# Patient Record
Sex: Female | Born: 1985 | Race: White | Hispanic: No | Marital: Married | Smoking: Current every day smoker
Health system: Southern US, Community
[De-identification: ages and names within clinical notes are randomized; demographics above are authoritative.]

## PROBLEM LIST (undated history)

## (undated) DIAGNOSIS — F32A Depression, unspecified: Secondary | ICD-10-CM

## (undated) DIAGNOSIS — F329 Major depressive disorder, single episode, unspecified: Secondary | ICD-10-CM

## (undated) HISTORY — DX: Major depressive disorder, single episode, unspecified: F32.9

## (undated) HISTORY — PX: ESOPHAGOGASTRODUODENOSCOPY ENDOSCOPY: SHX5814

## (undated) HISTORY — DX: Depression, unspecified: F32.A

## (undated) HISTORY — PX: OTHER SURGICAL HISTORY: SHX169

## (undated) HISTORY — PX: TUBAL LIGATION: SHX77

---

## 2009-11-24 ENCOUNTER — Emergency Department: Payer: Self-pay | Admitting: Emergency Medicine

## 2010-03-25 ENCOUNTER — Emergency Department: Payer: Self-pay | Admitting: Emergency Medicine

## 2010-04-20 ENCOUNTER — Emergency Department: Payer: Self-pay | Admitting: Emergency Medicine

## 2010-09-10 ENCOUNTER — Emergency Department: Payer: Self-pay | Admitting: Emergency Medicine

## 2010-09-12 ENCOUNTER — Ambulatory Visit: Payer: Self-pay | Admitting: Nephrology

## 2011-02-16 ENCOUNTER — Emergency Department: Payer: Self-pay | Admitting: Emergency Medicine

## 2011-03-04 ENCOUNTER — Emergency Department: Payer: Self-pay | Admitting: Emergency Medicine

## 2011-04-28 ENCOUNTER — Emergency Department: Payer: Self-pay | Admitting: Emergency Medicine

## 2014-09-01 LAB — HM PAP SMEAR: HM Pap smear: NEGATIVE

## 2015-08-12 ENCOUNTER — Encounter: Payer: Self-pay | Admitting: Emergency Medicine

## 2015-08-12 ENCOUNTER — Emergency Department
Admission: EM | Admit: 2015-08-12 | Discharge: 2015-08-13 | Disposition: A | Discharge status: Home / Self Care | Payer: BLUE CROSS/BLUE SHIELD | Attending: Emergency Medicine | Admitting: Emergency Medicine

## 2015-08-12 DIAGNOSIS — R102 Pelvic and perineal pain: Secondary | ICD-10-CM

## 2015-08-12 DIAGNOSIS — N760 Acute vaginitis: Secondary | ICD-10-CM | POA: Diagnosis not present

## 2015-08-12 DIAGNOSIS — F17200 Nicotine dependence, unspecified, uncomplicated: Secondary | ICD-10-CM | POA: Insufficient documentation

## 2015-08-12 DIAGNOSIS — B9689 Other specified bacterial agents as the cause of diseases classified elsewhere: Secondary | ICD-10-CM

## 2015-08-12 LAB — URINALYSIS COMPLETE WITH MICROSCOPIC (ARMC ONLY)
Bacteria, UA: NONE SEEN
Bilirubin Urine: NEGATIVE
GLUCOSE, UA: NEGATIVE mg/dL
Hgb urine dipstick: NEGATIVE
Ketones, ur: NEGATIVE mg/dL
Leukocytes, UA: NEGATIVE
Nitrite: NEGATIVE
Protein, ur: NEGATIVE mg/dL
Specific Gravity, Urine: 1.015 (ref 1.005–1.030)
pH: 7 (ref 5.0–8.0)

## 2015-08-12 LAB — CBC WITH DIFFERENTIAL/PLATELET
BASOS ABS: 0.1 10*3/uL (ref 0–0.1)
BASOS PCT: 1 %
Eosinophils Absolute: 0.5 10*3/uL (ref 0–0.7)
Eosinophils Relative: 6 %
HEMATOCRIT: 39.5 % (ref 35.0–47.0)
Hemoglobin: 13.4 g/dL (ref 12.0–16.0)
LYMPHS PCT: 36 %
Lymphs Abs: 3.1 10*3/uL (ref 1.0–3.6)
MCH: 28.4 pg (ref 26.0–34.0)
MCHC: 34 g/dL (ref 32.0–36.0)
MCV: 83.6 fL (ref 80.0–100.0)
Monocytes Absolute: 0.7 10*3/uL (ref 0.2–0.9)
Monocytes Relative: 8 %
NEUTROS ABS: 4.4 10*3/uL (ref 1.4–6.5)
NEUTROS PCT: 49 %
Platelets: 298 10*3/uL (ref 150–440)
RBC: 4.73 MIL/uL (ref 3.80–5.20)
RDW: 14.8 % — ABNORMAL HIGH (ref 11.5–14.5)
WBC: 8.7 10*3/uL (ref 3.6–11.0)

## 2015-08-12 LAB — WET PREP, GENITAL
Sperm: NONE SEEN
Trich, Wet Prep: NONE SEEN
Yeast Wet Prep HPF POC: NONE SEEN

## 2015-08-12 LAB — COMPREHENSIVE METABOLIC PANEL
ALBUMIN: 4.4 g/dL (ref 3.5–5.0)
ALT: 14 U/L (ref 14–54)
AST: 18 U/L (ref 15–41)
Alkaline Phosphatase: 42 U/L (ref 38–126)
Anion gap: 7 (ref 5–15)
BILIRUBIN TOTAL: 0.2 mg/dL — AB (ref 0.3–1.2)
BUN: 20 mg/dL (ref 6–20)
CHLORIDE: 106 mmol/L (ref 101–111)
CO2: 25 mmol/L (ref 22–32)
CREATININE: 0.62 mg/dL (ref 0.44–1.00)
Calcium: 9.2 mg/dL (ref 8.9–10.3)
GFR calc Af Amer: >60 mL/min (ref >60)
GLUCOSE: 80 mg/dL (ref 65–99)
POTASSIUM: 4 mmol/L (ref 3.5–5.1)
Sodium: 138 mmol/L (ref 135–145)
TOTAL PROTEIN: 6.7 g/dL (ref 6.5–8.1)

## 2015-08-12 LAB — LIPASE, BLOOD: LIPASE: 28 U/L (ref 11–51)

## 2015-08-12 LAB — POCT PREGNANCY, URINE: PREG TEST UR: NEGATIVE

## 2015-08-12 MED ORDER — MORPHINE SULFATE (PF) 4 MG/ML IV SOLN
4.0000 mg | Freq: Once | INTRAVENOUS | Status: AC
  Administered 2015-08-12: 4 mg via INTRAVENOUS

## 2015-08-12 MED ORDER — MORPHINE SULFATE (PF) 4 MG/ML IV SOLN
INTRAVENOUS | Status: AC
  Administered 2015-08-12: 4 mg via INTRAVENOUS
  Filled 2015-08-12: qty 1

## 2015-08-12 MED ORDER — SODIUM CHLORIDE 0.9 % IV BOLUS (SEPSIS)
500.0000 mL | Freq: Once | INTRAVENOUS | Status: AC
  Administered 2015-08-12: 500 mL via INTRAVENOUS

## 2015-08-12 NOTE — ED Notes (Signed)
Pt informed of extended wait time for ultrasound. Pt and pts mother left ED treatment bed to go to vending machine. Pt in wheelchair and in NAD at this time.

## 2015-08-12 NOTE — ED Notes (Signed)
Pt states "it hurts down there, it feels like a lot of pressure." pt denies dysuria or vaginal discharge. Pt states "i feel better if i push." pt denies fever, diarrhea, vomiting.

## 2015-08-12 NOTE — ED Provider Notes (Addendum)
[facility] Emergency Department Provider Note  ____________________________________________   I have reviewed the triage vital signs and the nursing notes.   HISTORY  Chief Complaint Vaginal Pain    HPI Gabrielle Harmon is a 30 y.o. female presents today complaining of pelvic discomfort. No history of nausea vomiting or diarrhea. Pain began gradually this morning around 8:00. Has not had pain like this before except for when she was pregnant. Patient has had a tubal ligation. She denies pregnancy. No significant prior surgical history. She denies any fever or chills. The pain does not seem to radiate. Denies vaginal discharge. Denies dysuria. Denies urinary frequency. Denies flank pain. States that she simply has a discomfort like a pressure sensation in her pelvic region. Denies constipation.      History reviewed. No pertinent past medical history.  There are no active problems to display for this patient.   Past Surgical History  Procedure Laterality Date  . Esophagogastroduodenoscopy endoscopy    . Tubal ligation    . Adenocetomy      No current outpatient prescriptions on file.  Allergies Review of patient's allergies indicates no known allergies.  No family history on file.  Social History Social History  Substance Use Topics  . Smoking status: Current Every Day Smoker  . Smokeless tobacco: Never Used  . Alcohol Use: No    Review of Systems Constitutional: No fever/chills Eyes: No visual changes. ENT: No sore throat. No stiff neck no neck pain Cardiovascular: Denies chest pain. Respiratory: Denies shortness of breath. Gastrointestinal:   no vomiting.  No diarrhea.  No constipation. Genitourinary: Negative for dysuria. Musculoskeletal: Negative lower extremity swelling Skin: Negative for rash. Neurological: Negative for headaches, focal weakness or numbness. 10-point ROS otherwise  negative.  ____________________________________________   PHYSICAL EXAM:  VITAL SIGNS: ED Triage Vitals  Enc Vitals Group     BP 08/12/15 1850 112/75 mmHg     Pulse Rate 08/12/15 1850 88     Resp 08/12/15 1850 16     Temp 08/12/15 1850 98.2 F (36.8 C)     Temp Source 08/12/15 1850 Oral     SpO2 08/12/15 1850 100 %     Weight 08/12/15 1850 165 lb (74.844 kg)     Height 08/12/15 1850  (1.626 m)     Head Cir --      Peak Flow --      Pain Score 08/12/15 1910 7     Pain Loc --      Pain Edu? --      Excl. in GC? --     Constitutional: Alert and oriented. Well appearing and in no acute distress. Eyes: Conjunctivae are normal. PERRL. EOMI. Head: Atraumatic. Nose: No congestion/rhinnorhea. Mouth/Throat: Mucous membranes are moist.  Oropharynx non-erythematous. Neck: No stridor.   Nontender with no meningismus Cardiovascular: Normal rate, regular rhythm. Grossly normal heart sounds.  Good peripheral circulation. Respiratory: Normal respiratory effort.  No retractions. Lungs CTAB. Abdominal: Soft and Minimal tenderness to deep palpation in the suprapubic region. No distention. No guarding no rebound Back:  There is no focal tenderness or step off there is no midline tenderness there are no lesions noted. there is no CVA tenderness Musculoskeletal: No lower extremity tenderness. No joint effusions, no DVT signs strong distal pulses no edema Neurologic:  Normal speech and language. No gross focal neurologic deficits are appreciated.  Skin:  Skin is warm, dry and intact. No rash noted. Psychiatric: Mood and affect are normal. Speech  and behavior are normal.  ____________________________________________   LABS (all labs ordered are listed, but only abnormal results are displayed)  Labs Reviewed  URINALYSIS COMPLETEWITH MICROSCOPIC ([facility] ONLY) - Abnormal; Notable for the following:    Color, Urine YELLOW (*)    APPearance CLOUDY (*)    Squamous Epithelial / LPF 0-5 (*)     All other components within normal limits  CHLAMYDIA/NGC RT PCR ([facility] ONLY)  WET PREP, GENITAL  CBC WITH DIFFERENTIAL/PLATELET  COMPREHENSIVE METABOLIC PANEL  LIPASE, BLOOD  POC URINE PREG, ED  POCT PREGNANCY, URINE   ____________________________________________  EKG  I personally interpreted any EKGs ordered by me or triage  ____________________________________________  RADIOLOGY  I reviewed any imaging ordered by me or triage that were performed during my shift and, if possible, patient and/or family made aware of any abnormal findings. ____________________________________________   PROCEDURES  Procedure(s) performed: None  Critical Care performed: None  ____________________________________________   INITIAL IMPRESSION / ASSESSMENT AND PLAN / ED COURSE  Pertinent labs & imaging results that were available during my care of the patient were reviewed by me and considered in my medical decision making (see chart for details).  Urinalysis is reassuring, differential is broad, we'll obtain blood work and a pelvic exam and reassess. Abdomen is nonsurgical vital signs are reassuring.  ----------------------------------------- 9:39 PM on 08/12/2015 -----------------------------------------  Pelvic exam: Female nurse chaperone present, no external lesions noted, physiologic vaginal discharge noted with no purulent discharge, no cervical motion tenderness, there is left adnexal tenderness, no mass noted, mild right adnexal tenderness noted no right adnexal mass, there is no significant midline tenderness or or mass. No vaginal bleeding_____________  ----------------------------------------- 11:28 PM on 08/12/2015 -----------------------------------------  Awaiting US.  Dr. Manson PasseyBrown to take over at this time. _______________________________   FINAL CLINICAL IMPRESSION(S) / ED DIAGNOSES  Final diagnoses:  None      This chart was dictated using voice  recognition software.  Despite best efforts to proofread,  errors can occur which can change meaning.     Jeanmarie PlantJames A Meila Berke, MD 08/12/15 [id number]  Jeanmarie PlantJames A Shanard Treto, MD 08/12/15 [id number]  Jeanmarie PlantJames A Koreen Lizaola, MD 08/12/15 [phone]

## 2015-08-12 NOTE — ED Notes (Signed)
Pt states she had similar pain when [redacted] wks pregnant and baby was pressing on her kidneys. Pt has since had tubal ligation.

## 2015-08-13 ENCOUNTER — Emergency Department: Payer: BLUE CROSS/BLUE SHIELD

## 2015-08-13 LAB — CHLAMYDIA/NGC RT PCR (ARMC ONLY)
CHLAMYDIA TR: NOT DETECTED
N GONORRHOEAE: NOT DETECTED

## 2015-08-13 MED ORDER — OXYCODONE-ACETAMINOPHEN 5-325 MG PO TABS
ORAL_TABLET | ORAL | Status: AC
  Administered 2015-08-13: 1 via ORAL
  Filled 2015-08-13: qty 1

## 2015-08-13 MED ORDER — OXYCODONE-ACETAMINOPHEN 5-325 MG PO TABS
1.0000 | ORAL_TABLET | Freq: Once | ORAL | Status: AC
  Administered 2015-08-13: 1 via ORAL

## 2015-08-13 MED ORDER — OXYCODONE-ACETAMINOPHEN 5-325 MG PO TABS
ORAL_TABLET | ORAL | Status: AC
  Filled 2015-08-13: qty 1

## 2015-08-13 MED ORDER — METRONIDAZOLE 500 MG PO TABS: 500.0000 mg | ORAL_TABLET | Freq: Two times a day (BID) | ORAL | Status: AC

## 2015-08-13 MED ORDER — OXYCODONE-ACETAMINOPHEN 5-325 MG PO TABS: 1.0000 | ORAL_TABLET | ORAL | Status: DC | PRN

## 2015-08-13 NOTE — ED Notes (Signed)
Pt. Verbalizes understanding of d/c instructions, prescriptions, and follow-up care. Pt. displays no s/s of distress at this time. VS stable. Pt. Out of the unit in the wheelchair with mother.

## 2015-08-13 NOTE — Discharge Instructions (Signed)

## 2015-08-13 NOTE — ED Provider Notes (Signed)
I assumed care from Dr. Alphonzo LemmingsMcShane at 11:30 PM ultrasound revealed: US Pelvis Complete (Final result) Result time: 08/13/15 01:29:30   Final result by Rad Results In Interface (08/13/15 01:29:30)   Narrative:   CLINICAL DATA: Acute onset of mid pelvic pressure. Initial encounter.  EXAM: TRANSABDOMINAL AND TRANSVAGINAL ULTRASOUND OF PELVIS  TECHNIQUE: Both transabdominal and transvaginal ultrasound examinations of the pelvis were performed. Transabdominal technique was performed for global imaging of the pelvis including uterus, ovaries, adnexal regions, and pelvic cul-de-sac. It was necessary to proceed with endovaginal exam following the transabdominal exam to visualize the uterus and ovaries in greater detail.  COMPARISON: CT of the abdomen and pelvis from 09/12/2010  FINDINGS: Uterus  Measurements: 7.6 x 3.8 x 5.4 cm. No fibroids or other mass visualized.  Endometrium  Thickness: 1.0 cm. No focal abnormality visualized.  Right ovary  Measurements: 3.1 x 2.0 x 2.0 cm. Normal appearance/no adnexal mass.  Left ovary  Measurements: 3.3 x 2.6 x 2.2 cm. Normal appearance/no adnexal mass.  Other findings  No abnormal free fluid.  IMPRESSION: Unremarkable pelvic ultrasound. No evidence for ovarian torsion.   Electronically Signed By: Roanna RaiderJeffery Chang M.D. On: 08/13/2015 01:29          US Transvaginal Non-OB (Final result) Result time: 08/13/15 01:29:30   Final result by Rad Results In Interface (08/13/15 01:29:30)   Narrative:   CLINICAL DATA: Acute onset of mid pelvic pressure. Initial encounter.  EXAM: TRANSABDOMINAL AND TRANSVAGINAL ULTRASOUND OF PELVIS  TECHNIQUE: Both transabdominal and transvaginal ultrasound examinations of the pelvis were performed. Transabdominal technique was performed for global imaging of the pelvis including uterus, ovaries, adnexal regions, and pelvic cul-de-sac. It was necessary to proceed with endovaginal exam  following the transabdominal exam to visualize the uterus and ovaries in greater detail.  COMPARISON: CT of the abdomen and pelvis from 09/12/2010  FINDINGS: Uterus  Measurements: 7.6 x 3.8 x 5.4 cm. No fibroids or other mass visualized.  Endometrium  Thickness: 1.0 cm. No focal abnormality visualized.  Right ovary  Measurements: 3.1 x 2.0 x 2.0 cm. Normal appearance/no adnexal mass.  Left ovary  Measurements: 3.3 x 2.6 x 2.2 cm. Normal appearance/no adnexal mass.  Other findings  No abnormal free fluid.  IMPRESSION: Unremarkable pelvic ultrasound. No evidence for ovarian torsion.   Electronically Signed By: Roanna RaiderJeffery Chang M.D. On: 08/13/2015 01:29      Laboratory data revealed evidence of bacterial vaginosis. Patient be referred to Dr. Greggory Keenefrancesco for pelvic pain  Gabrielle Harmon N Brown, MD 08/13/15 [phone]

## 2017-01-10 ENCOUNTER — Ambulatory Visit (INDEPENDENT_AMBULATORY_CARE_PROVIDER_SITE_OTHER): Payer: BLUE CROSS/BLUE SHIELD | Admitting: Maternal Newborn

## 2017-01-10 ENCOUNTER — Encounter: Payer: Self-pay | Admitting: Maternal Newborn

## 2017-01-10 VITALS — BP 110/60 | HR 108 | Ht 64.0 in | Wt 162.0 lb

## 2017-01-10 DIAGNOSIS — N946 Dysmenorrhea, unspecified: Secondary | ICD-10-CM | POA: Diagnosis not present

## 2017-01-10 DIAGNOSIS — R102 Pelvic and perineal pain: Secondary | ICD-10-CM | POA: Diagnosis not present

## 2017-01-10 DIAGNOSIS — Z124 Encounter for screening for malignant neoplasm of cervix: Secondary | ICD-10-CM | POA: Diagnosis not present

## 2017-01-10 DIAGNOSIS — Z01419 Encounter for gynecological examination (general) (routine) without abnormal findings: Secondary | ICD-10-CM

## 2017-01-10 MED ORDER — NORETHIN-ETH ESTRAD-FE BIPHAS 1 MG-10 MCG / 10 MCG PO TABS: 1.0000 | ORAL_TABLET | Freq: Every day | ORAL | 11 refills | Status: DC

## 2017-01-10 NOTE — Progress Notes (Signed)
Gynecology Annual Exam  PCP: [facility]  Chief Complaint:  Chief Complaint  Patient presents with  . Gynecologic Exam    cramping in between periods; had two cycles last in the last month and a half.    History of Present Illness: Patient is a 31 y.o. [id number] presents for annual exam. The patient has no complaints today.   LMP: Patient's last menstrual period was 12/17/2016 (exact date). Average Interval: regular, 30 days, but has had two periods in last 1.5 months Duration of flow: 5 days Heavy Menses: no Clots: no Intermenstrual Bleeding: yes Postcoital Bleeding: no Dysmenorrhea: yes  The patient is sexually active. She currently uses tubal ligation for contraception. She denies dyspareunia.  The patient does perform self breast exams.  There is no notable family history of breast or ovarian cancer in her family.  The patient wears seatbelts: yes.   The patient has regular exercise: yes.    The patient denies current symptoms of depression.    Review of Systems  Constitutional: Negative for chills, fever and weight loss.  HENT: Negative for congestion, hearing loss and sinus pain.   Eyes: Negative for blurred vision, pain and redness.  Respiratory: Negative for cough, shortness of breath and wheezing.   Cardiovascular: Negative for chest pain and palpitations.  Gastrointestinal: Positive for heartburn. Negative for diarrhea and nausea.  Genitourinary: Negative for dysuria, frequency and urgency.       Pelvic pain  Musculoskeletal: Positive for joint pain.  Skin: Negative.   Neurological: Negative for dizziness, sensory change and headaches.  Endo/Heme/Allergies: Negative.   Psychiatric/Behavioral: Negative for depression. The patient is not nervous/anxious.   All other systems reviewed and are negative.   Past Medical History:  Past Medical History:  Diagnosis Date  . Depression     Past Surgical History:  Past Surgical History:  Procedure  Laterality Date  . adenocetomy    . ESOPHAGOGASTRODUODENOSCOPY ENDOSCOPY    . TUBAL LIGATION      Gynecologic History:  Patient's last menstrual period was 12/17/2016 (exact date). Contraception: tubal ligation Last Pap: 09/01/2014. Results were: NIL and HR HPV negative   Obstetric History: [id number]  Family History:  Family History  Problem Relation Age of Onset  . Diabetes Paternal Aunt     Social History:  Social History   Social History  . Marital status: Married    Spouse name: N/A  . Number of children: 3  . Years of education: N/A   Occupational History  . Not on file.   Social History Main Topics  . Smoking status: Current Every Day Smoker    Types: Cigarettes  . Smokeless tobacco: Never Used     Comment: 1/2 ppd  . Alcohol use No  . Drug use: No  . Sexual activity: Yes    Birth control/ protection: Surgical   Other Topics Concern  . Not on file   Social History Narrative  . No narrative on file    Allergies:  No Known Allergies  Medications: Prior to Admission medications   Medication Sig Start Date End Date Taking? Authorizing Provider  citalopram (CELEXA) 40 MG tablet Take 40 mg by mouth every morning. 12/25/16   [provider]  IBU 600 MG tablet Take 1 tablet by mouth as needed. 12/25/16   [provider]  Phendimetrazine Tartrate 35 MG TABS Take 1 tablet by mouth daily. 12/27/16   [provider]  traMADol (ULTRAM) 50 MG tablet Take 1 tablet by  mouth as needed. 12/18/16   [provider]    Physical Exam Vitals: Blood pressure 110/60, pulse (!) 108, height 5\' 4"  (1.626 m), weight 162 lb (73.5 kg), last menstrual period 12/17/2016.  General: NAD HEENT: normocephalic, anicteric Thyroid: no enlargement, no palpable nodules Pulmonary: No increased work of breathing, CTAB Cardiovascular: RRR, distal pulses 2+ Breast: Breasts symmetrical, no tenderness, no palpable nodules or masses, no skin or nipple  retraction present, no nipple discharge.  No axillary or supraclavicular lymphadenopathy. Abdomen: NABS, soft, non-tender, non-distended.  Umbilicus without lesions.  No hepatomegaly, splenomegaly or masses palpable. No evidence of hernia  Genitourinary:  External: Normal external female genitalia.  Normal urethral meatus, normal  Bartholin's and Skene's glands.    Vagina: Normal vaginal mucosa, no evidence of prolapse.    Cervix: Grossly normal in appearance, no bleeding  Uterus: Non-enlarged, mobile, normal contour.  No CMT  Adnexa: ovaries non-enlarged, no adnexal masses, mild tenderness to palpation  in bilateral adnexal area  Rectal: deferred  Lymphatic: no evidence of inguinal lymphadenopathy Extremities: no edema, erythema, or tenderness Neurologic: Grossly intact Psychiatric: mood appropriate, affect full  Assessment: 31 y.o. [id number] routine annual exam with dysmenorrhea and pelvic pain.  Plan: Problem List Items Addressed This Visit    None    Visit Diagnoses    Encounter for annual routine gynecological examination    -  Primary   Pap smear for cervical cancer screening       Relevant Orders   Pap IG and HPV (high risk) DNA detection   Severe dysmenorrhea       Relevant Medications   Norethindrone-Ethinyl Estradiol-Fe Biphas (LO LOESTRIN FE) 1 MG-10 MCG / 10 MCG tablet      1) STI screening was offered and declined.  2) ASCCP guidelines and rationale discussed.  Patient opts for yearly screening interval.  3) Contraception - Patient has had a tubal ligation. Asked druing this visit about tubal reversal.  Advised her to make appointment with MD who would review surgical records and determine whether this is an option and counsel on risks/benefits/chances of successful pregnancy.  4) Routine healthcare maintenance including cholesterol, diabetes screening discussed; managed by PCP.  5) Has severe dysmenorrhea with cycles, cramping and pelvic pain in between cycles, and  recently had an irregular cycle. Discussed endometriosis as possible underlying pathology, and that the definitive diagnosis would be a laparoscopy. She is already taking NSAIDs for chronic joint pain and does not get relief. Will try Lo LoEstrin to see if symptoms improve. ACHES reviewed and patient warned about increased risk when smoking with OCP use.   6) RTO in 2 months if OCPs do not help control dysmenorrhea/cramping.   Marcelyn BruinsJacelyn Kaela Beitz, CNM 01/10/2017  4:54 PM

## 2017-01-15 ENCOUNTER — Encounter: Payer: Self-pay | Admitting: Maternal Newborn

## 2017-01-15 LAB — PAP IG AND HPV HIGH-RISK
HPV, HIGH-RISK: POSITIVE — AB
PAP Smear Comment: 0

## 2017-01-16 ENCOUNTER — Encounter: Payer: Self-pay | Admitting: Maternal Newborn

## 2017-01-17 ENCOUNTER — Other Ambulatory Visit: Payer: Self-pay | Admitting: Maternal Newborn

## 2017-01-17 MED ORDER — CYCLOBENZAPRINE HCL 10 MG PO TABS: 10.0000 mg | ORAL_TABLET | Freq: Every day | ORAL | 0 refills | Status: DC

## 2017-01-17 NOTE — Progress Notes (Unsigned)
Sent Rx for cyclobenzaprine per patient request for medication to help with chronic pelvic pain. MyChart message sent to inform patient of prescription.  Marcelyn BruinsJacelyn Schmid, CNM 01/17/2017  9:35 PM

## 2017-03-12 ENCOUNTER — Other Ambulatory Visit: Payer: Self-pay | Admitting: Maternal Newborn

## 2017-04-15 ENCOUNTER — Other Ambulatory Visit: Payer: Self-pay | Admitting: Maternal Newborn

## 2017-04-26 ENCOUNTER — Telehealth: Payer: Self-pay

## 2017-04-26 NOTE — Telephone Encounter (Signed)
Pt states she lost a large clot but not currently on her period. She is experiencing a lot of cramping. Please have JS advise due to pt history. Thank you. Pt cb# [phone].

## 2017-05-02 NOTE — Telephone Encounter (Signed)
Thanks, she is coming in next week for pelvic ultrasound and f/u appointment.

## 2017-05-03 ENCOUNTER — Ambulatory Visit: Payer: BLUE CROSS/BLUE SHIELD | Admitting: Maternal Newborn

## 2017-05-06 ENCOUNTER — Other Ambulatory Visit: Payer: Self-pay | Admitting: Maternal Newborn

## 2017-05-06 DIAGNOSIS — N939 Abnormal uterine and vaginal bleeding, unspecified: Secondary | ICD-10-CM

## 2017-05-07 ENCOUNTER — Encounter: Payer: Self-pay | Admitting: Obstetrics & Gynecology

## 2017-05-07 ENCOUNTER — Ambulatory Visit: Payer: Self-pay | Admitting: Maternal Newborn

## 2017-05-07 ENCOUNTER — Ambulatory Visit (INDEPENDENT_AMBULATORY_CARE_PROVIDER_SITE_OTHER): Payer: BLUE CROSS/BLUE SHIELD | Admitting: Obstetrics & Gynecology

## 2017-05-07 ENCOUNTER — Ambulatory Visit (INDEPENDENT_AMBULATORY_CARE_PROVIDER_SITE_OTHER): Payer: BLUE CROSS/BLUE SHIELD

## 2017-05-07 VITALS — BP 100/60 | Ht 64.0 in | Wt 176.0 lb

## 2017-05-07 DIAGNOSIS — N938 Other specified abnormal uterine and vaginal bleeding: Secondary | ICD-10-CM | POA: Diagnosis not present

## 2017-05-07 DIAGNOSIS — N939 Abnormal uterine and vaginal bleeding, unspecified: Secondary | ICD-10-CM

## 2017-05-07 NOTE — Patient Instructions (Signed)
Call and schedule after your next period starts  Levonorgestrel intrauterine device (IUD) What is this medicine? LEVONORGESTREL IUD (LEE voe nor jes trel) is a contraceptive (birth control) device. The device is placed inside the uterus by a healthcare professional. It is used to prevent pregnancy. This device can also be used to treat heavy bleeding that occurs during your period. This medicine may be used for other purposes; ask your health care provider or pharmacist if you have questions. COMMON BRAND NAME(S): Cameron Ali What should I tell my health care provider before I take this medicine? They need to know if you have any of these conditions: -abnormal Pap smear -cancer of the breast, uterus, or cervix -diabetes -endometritis -genital or pelvic infection now or in the past -have more than one sexual partner or your partner has more than one partner -heart disease -history of an ectopic or tubal pregnancy -immune system problems -IUD in place -liver disease or tumor -problems with blood clots or take blood-thinners -seizures -use intravenous drugs -uterus of unusual shape -vaginal bleeding that has not been explained -an unusual or allergic reaction to levonorgestrel, other hormones, silicone, or polyethylene, medicines, foods, dyes, or preservatives -pregnant or trying to get pregnant -breast-feeding How should I use this medicine? This device is placed inside the uterus by a health care professional. Talk to your pediatrician regarding the use of this medicine in children. Special care may be needed. Overdosage: If you think you have taken too much of this medicine contact a poison control center or emergency room at once. NOTE: This medicine is only for you. Do not share this medicine with others. What if I miss a dose? This does not apply. Depending on the brand of device you have inserted, the device will need to be replaced every 3 to 5 years if you  wish to continue using this type of birth control. What may interact with this medicine? Do not take this medicine with any of the following medications: -amprenavir -bosentan -fosamprenavir This medicine may also interact with the following medications: -aprepitant -armodafinil -barbiturate medicines for inducing sleep or treating seizures -bexarotene -boceprevir -griseofulvin -medicines to treat seizures like carbamazepine, ethotoin, felbamate, oxcarbazepine, phenytoin, topiramate -modafinil -pioglitazone -rifabutin -rifampin -rifapentine -some medicines to treat HIV infection like atazanavir, efavirenz, indinavir, lopinavir, nelfinavir, tipranavir, ritonavir -St. John's wort -warfarin This list may not describe all possible interactions. Give your health care provider a list of all the medicines, herbs, non-prescription drugs, or dietary supplements you use. Also tell them if you smoke, drink alcohol, or use illegal drugs. Some items may interact with your medicine. What should I watch for while using this medicine? Visit your doctor or health care professional for regular check ups. See your doctor if you or your partner has sexual contact with others, becomes HIV positive, or gets a sexual transmitted disease. This product does not protect you against HIV infection (AIDS) or other sexually transmitted diseases. You can check the placement of the IUD yourself by reaching up to the top of your vagina with clean fingers to feel the threads. Do not pull on the threads. It is a good habit to check placement after each menstrual period. Call your doctor right away if you feel more of the IUD than just the threads or if you cannot feel the threads at all. The IUD may come out by itself. You may become pregnant if the device comes out. If you notice that the IUD has come out use a  backup birth control method like condoms and call your health care provider. Using tampons will not change the  position of the IUD and are okay to use during your period. This IUD can be safely scanned with magnetic resonance imaging (MRI) only under specific conditions. Before you have an MRI, tell your healthcare provider that you have an IUD in place, and which type of IUD you have in place. What side effects may I notice from receiving this medicine? Side effects that you should report to your doctor or health care professional as soon as possible: -allergic reactions like skin rash, itching or hives, swelling of the face, lips, or tongue -fever, flu-like symptoms -genital sores -high blood pressure -no menstrual period for 6 weeks during use -pain, swelling, warmth in the leg -pelvic pain or tenderness -severe or sudden headache -signs of pregnancy -stomach cramping -sudden shortness of breath -trouble with balance, talking, or walking -unusual vaginal bleeding, discharge -yellowing of the eyes or skin Side effects that usually do not require medical attention (report to your doctor or health care professional if they continue or are bothersome): -acne -breast pain -change in sex drive or performance -changes in weight -cramping, dizziness, or faintness while the device is being inserted -headache -irregular menstrual bleeding within first 3 to 6 months of use -nausea This list may not describe all possible side effects. Call your doctor for medical advice about side effects. You may report side effects to FDA at 1-800-FDA-1088. Where should I keep my medicine? This does not apply. NOTE: This sheet is a summary. It may not cover all possible information. If you have questions about this medicine, talk to your doctor, pharmacist, or health care provider.  2018 Elsevier/Gold Standard (2015-12-09 14:14:56)

## 2017-05-07 NOTE — Progress Notes (Signed)
  HPI: Dysfunctional Uterine Bleeding Patient complains of irregular menses. She had been bleeding irregularly. She is now bleeding every 14-30 days and menses are lasting 3 days. She changes her pad or tampon every a few hours. Clots are variable in size. Dysmenorrhea:moderate, occurring throughout menses and even when not. Cyclic symptoms include: nausea. Current contraception: tubal ligation. History of infertility: no. History of abnormal Pap smear: no.  Ultrasound demonstrates no masses seen These findings are Pelvis normal  PMHx: She  has a past medical history of Depression. Also,  has a past surgical history that includes Esophagogastroduodenoscopy endoscopy; Tubal ligation; and adenocetomy., family history includes Diabetes in her paternal aunt.,  reports that she has been smoking cigarettes.  she has never used smokeless tobacco. She reports that she does not drink alcohol or use drugs.  She has a current medication list which includes the following prescription(s): citalopram, cyclobenzaprine, tramadol, ibu, norethindrone-ethinyl estradiol-fe biphas, and phendimetrazine tartrate. Also, has No Known Allergies.  Review of Systems  All other systems reviewed and are negative.  Objective: BP 100/60   Ht 5\' 4"  (1.626 m)   Wt 176 lb (79.8 kg)   LMP 03/27/2017   BMI 30.21 kg/m   Physical examination Constitutional NAD, Conversant  Skin No rashes, lesions or ulceration.   Extremities: Moves all appropriately.  Normal ROM for age. No lymphadenopathy.  Neuro: Grossly intact  Psych: Oriented to PPT.  Normal mood. Normal affect.   No results found.  Assessment:  DUB (dysfunctional uterine bleeding) -  Patient has abnormal uterine bleeding . She has a normal exam today, with no evidence of lesions.  Evaluation includes the following: exam, labs such as hormonal testing, and pelvic ultrasound to evaluate for any structural gynecologic abnormalities.  Patient to follow up after  testing.  Treatment option for menorrhagia or menometrorrhagia discussed in great detail with the patient.  Options include hormonal therapy, IUD therapy such as Mirena, D&C, Ablation, and Hysterectomy.  The pros and cons of each option discussed with patient.  Desires IUD at this time.  Schedule after periods.  Also, desires tubal reversal Plan: Ambulatory referral to Infertility  A total of 15 minutes were spent face-to-face with the patient during this encounter and over half of that time dealt with counseling and coordination of care.  Annamarie MajorPaul Jayel Scaduto, MD, Merlinda FrederickFACOG [facility], [facility] 05/07/2017  2:30 PM

## 2017-05-16 ENCOUNTER — Encounter: Payer: Self-pay | Admitting: Maternal Newborn

## 2017-05-17 ENCOUNTER — Encounter: Payer: Self-pay | Admitting: Maternal Newborn

## 2017-05-20 ENCOUNTER — Other Ambulatory Visit: Payer: Self-pay | Admitting: Maternal Newborn

## 2017-05-22 ENCOUNTER — Other Ambulatory Visit: Payer: Self-pay | Admitting: Maternal Newborn

## 2017-05-22 MED ORDER — CYCLOBENZAPRINE HCL 10 MG PO TABS: 10.0000 mg | ORAL_TABLET | Freq: Every day | ORAL | 0 refills | Status: DC

## 2017-05-22 NOTE — Telephone Encounter (Signed)
This was sitting in [facility]-Clinical pool. Message forwarded to Annie Sableita, JS nurse

## 2017-06-14 ENCOUNTER — Telehealth: Payer: Self-pay | Admitting: Maternal Newborn

## 2017-06-14 NOTE — Telephone Encounter (Signed)
Noted. Will order to arrive by apt date/time. 

## 2017-06-14 NOTE — Telephone Encounter (Signed)
Patient is schedule 07/01/17 at 10 am with JS for mirena insertion

## 2017-06-25 ENCOUNTER — Other Ambulatory Visit: Payer: Self-pay | Admitting: Maternal Newborn

## 2017-07-01 ENCOUNTER — Ambulatory Visit: Payer: BLUE CROSS/BLUE SHIELD | Admitting: Maternal Newborn

## 2017-07-01 ENCOUNTER — Other Ambulatory Visit: Payer: Self-pay | Admitting: Maternal Newborn

## 2017-07-03 NOTE — Telephone Encounter (Signed)
Pt cancelled apt. Mirena placed back in stock. Will need to reserve again if pt reschedules.

## 2017-07-31 ENCOUNTER — Other Ambulatory Visit: Payer: Self-pay | Admitting: Maternal Newborn

## 2017-08-09 ENCOUNTER — Emergency Department
Admission: EM | Admit: 2017-08-09 | Discharge: 2017-08-09 | Disposition: A | Discharge status: Home / Self Care | Payer: BLUE CROSS/BLUE SHIELD | Attending: Emergency Medicine | Admitting: Emergency Medicine

## 2017-08-09 ENCOUNTER — Encounter: Payer: Self-pay | Admitting: Emergency Medicine

## 2017-08-09 ENCOUNTER — Other Ambulatory Visit: Payer: Self-pay

## 2017-08-09 DIAGNOSIS — Z79899 Other long term (current) drug therapy: Secondary | ICD-10-CM | POA: Insufficient documentation

## 2017-08-09 DIAGNOSIS — F17210 Nicotine dependence, cigarettes, uncomplicated: Secondary | ICD-10-CM | POA: Insufficient documentation

## 2017-08-09 DIAGNOSIS — R102 Pelvic and perineal pain: Secondary | ICD-10-CM | POA: Insufficient documentation

## 2017-08-09 DIAGNOSIS — M545 Low back pain: Secondary | ICD-10-CM | POA: Insufficient documentation

## 2017-08-09 LAB — URINALYSIS, COMPLETE (UACMP) WITH MICROSCOPIC
Bacteria, UA: NONE SEEN
Bilirubin Urine: NEGATIVE
Glucose, UA: NEGATIVE mg/dL
Hgb urine dipstick: NEGATIVE
Ketones, ur: NEGATIVE mg/dL
Leukocytes, UA: NEGATIVE
Nitrite: NEGATIVE
Protein, ur: NEGATIVE mg/dL
Specific Gravity, Urine: 1.009 (ref 1.005–1.030)
pH: 8 (ref 5.0–8.0)

## 2017-08-09 LAB — POCT PREGNANCY, URINE: PREG TEST UR: NEGATIVE

## 2017-08-09 MED ORDER — CEPHALEXIN 500 MG PO CAPS: 500.0000 mg | ORAL_CAPSULE | Freq: Three times a day (TID) | ORAL | 0 refills | Status: DC

## 2017-08-09 NOTE — ED Notes (Addendum)
Pt stating for the last 2 to 3 hours she has had pressure in her vagina. Pt stating lower back pain that is coming and going. Pt stating tubal ligation 9 years ago. Pt denying any vaginal discharge or issues with urination. Pt stating that her BM have been normal. Pushing down makes pain better. Pt stating the pain has progressively worsened since 1400

## 2017-08-09 NOTE — ED Triage Notes (Signed)
Has a pain/pressure in vaginal area.  Reports nothing is coming out or in there and she has checked.  Denies discharge.  Started today.  Denies urinary sx.  Pain relieved when bears down.  Not pregnant.

## 2017-08-09 NOTE — ED Notes (Signed)
Reviewed discharge instructions, follow-up care, and prescriptions with patient. Patient verbalized understanding of all information reviewed. Patient stable, with no distress noted at this time.    

## 2017-08-09 NOTE — ED Provider Notes (Signed)
[facility] Emergency Department Provider Note  ____________________________________________  Time seen: Approximately 6:41 PM  I have reviewed the triage vital signs and the nursing notes.   HISTORY  Chief Complaint vaginal pressure    HPI Gabrielle Harmon is a 32 y.o. female presents to the emergency department with suprapubic pain and bilateral low back  pain that started today.  Patient denies dysuria, increased urinary frequency or hematuria.  No nausea or vomiting.  No dyspareunia, changes in vaginal discharge or odor.  Patient has been in a monogamous relationship for the past 2 years and has no concerns for STDs.  No alleviating measures have been attempted.   Past Medical History:  Diagnosis Date  . Depression     Patient Active Problem List   Diagnosis Date Noted  . DUB (dysfunctional uterine bleeding) 05/07/2017  . Severe dysmenorrhea 01/10/2017  . Pelvic pain 01/10/2017    Past Surgical History:  Procedure Laterality Date  . adenocetomy    . ESOPHAGOGASTRODUODENOSCOPY ENDOSCOPY    . TUBAL LIGATION      Prior to Admission medications   Medication Sig Start Date End Date Taking? Authorizing Provider  cephALEXin (KEFLEX) 500 MG capsule Take 1 capsule (500 mg total) by mouth 3 (three) times daily for 10 days. 08/09/17 08/19/17  Orvil Feil, PA-C  citalopram (CELEXA) 40 MG tablet Take 40 mg by mouth every morning. 12/25/16   [provider]  cyclobenzaprine (FLEXERIL) 10 MG tablet TAKE 1 TABLET(10 MG) BY MOUTH AT BEDTIME 06/25/17   Oswaldo Conroy, CNM  cyclobenzaprine (FLEXERIL) 10 MG tablet TAKE 1 TABLET BY MOUTH AT BEDTIME 07/31/17   Oswaldo Conroy, CNM  IBU 600 MG tablet Take 1 tablet by mouth as needed. 12/25/16   [provider]  Norethindrone-Ethinyl Estradiol-Fe Biphas (LO LOESTRIN FE) 1 MG-10 MCG / 10 MCG tablet Take 1 tablet by mouth daily. Patient not taking: Reported on 05/07/2017 01/10/17   Oswaldo Conroy,  CNM  Phendimetrazine Tartrate 35 MG TABS Take 1 tablet by mouth daily. 12/27/16   [provider]  traMADol (ULTRAM) 50 MG tablet Take 1 tablet by mouth as needed. 12/18/16   [provider]    Allergies Patient has no known allergies.  Family History  Problem Relation Age of Onset  . Diabetes Paternal Aunt     Social History Social History   Tobacco Use  . Smoking status: Current Every Day Smoker    Types: Cigarettes  . Smokeless tobacco: Never Used  . Tobacco comment: 1/2 ppd  Substance Use Topics  . Alcohol use: No  . Drug use: No     Review of Systems  Constitutional: No fever/chills Eyes: No visual changes. No discharge ENT: No upper respiratory complaints. Cardiovascular: no chest pain. Respiratory: no cough. No SOB. Gastrointestinal: No abdominal pain.  No nausea, no vomiting.  No diarrhea.  No constipation. Genitourinary: Patient has suprapubic pain. Musculoskeletal: Negative for musculoskeletal pain. Skin: Negative for rash, abrasions, lacerations, ecchymosis. Neurological: Negative for headaches, focal weakness or numbness.   ____________________________________________   PHYSICAL EXAM:  VITAL SIGNS: ED Triage Vitals  Enc Vitals Group     BP 08/09/17 1731 104/65     Pulse Rate 08/09/17 1731 91     Resp 08/09/17 1731 18     Temp 08/09/17 1731 98.1 F (36.7 C)     Temp Source 08/09/17 1731 Oral     SpO2 08/09/17 1731 98 %     Weight 08/09/17 1727  178 lb (80.7 kg)     Height 08/09/17 1727 5\' 4"  (1.626 m)     Head Circumference --      Peak Flow --      Pain Score 08/09/17 1727 6     Pain Loc --      Pain Edu? --      Excl. in GC? --      Constitutional: Alert and oriented. Well appearing and in no acute distress. Eyes: Conjunctivae are normal. PERRL. EOMI. Head: Atraumatic. Cardiovascular: Normal rate, regular rhythm. Normal S1 and S2.  Good peripheral circulation. Respiratory: Normal respiratory effort without tachypnea or  retractions. Lungs CTAB. Good air entry to the bases with no decreased or absent breath sounds. Gastrointestinal: Bowel sounds 4 quadrants. Patient has suprapubic pain to palpation. No guarding or rigidity. No palpable masses. No distention. No CVA tenderness. Musculoskeletal: Full range of motion to all extremities. No gross deformities appreciated. Neurologic:  Normal speech and language. No gross focal neurologic deficits are appreciated.  Skin:  Skin is warm, dry and intact. No rash noted. Psychiatric: Mood and affect are normal. Speech and behavior are normal. Patient exhibits appropriate insight and judgement.   ____________________________________________   LABS (all labs ordered are listed, but only abnormal results are displayed)  Labs Reviewed  URINALYSIS, COMPLETE (UACMP) WITH MICROSCOPIC - Abnormal; Notable for the following components:      Result Value   Color, Urine YELLOW (*)    APPearance HAZY (*)    All other components within normal limits  URINE CULTURE  POC URINE PREG, ED  POCT PREGNANCY, URINE   ____________________________________________  EKG   ____________________________________________  RADIOLOGY   No results found.  ____________________________________________    PROCEDURES  Procedure(s) performed:    Procedures    Medications - No data to display   ____________________________________________   INITIAL IMPRESSION / ASSESSMENT AND PLAN / ED COURSE  Pertinent labs & imaging results that were available during my care of the patient were reviewed by me and considered in my medical decision making (see chart for details).  Review of the [place] CSRS was performed in accordance of the [facility] prior to dispensing any controlled drugs.     Assessment and Plan:  Suprapubic discomfort Patient presents to the emergency department with suprapubic pain and bilateral low back pain for the past 2 to 3 hours without fever and chills.  Patient  denies abdominal pain, nausea, vomiting or bilateral flank pain.  Abdominal exam was completely reassuring with only midline suprapubic tenderness elicited with palpation and no guarding or rigidity.  Urinalysis was  unconcerning for acute cystitis. However, given patient's symptoms I suspect that early cystitis is likely.  Urine was sent for culture and patient was started empirically on Keflex.  Patient was advised to return to the emergency department immediately if midline suprapubic tenderness continues after 2-3 days.  Patient declined pelvic examination in the emergency department as patient states that she has no concern for gonorrhea or chlamydia and has had no dyspareunia. Return precautions were given during two instances of this emergency department encounter. All patient questions were answered.     ____________________________________________  FINAL CLINICAL IMPRESSION(S) / ED DIAGNOSES  Final diagnoses:  Suprapubic pain      NEW MEDICATIONS STARTED DURING THIS VISIT:  ED Discharge Orders        Ordered    cephALEXin (KEFLEX) 500 MG capsule  3 times daily     08/09/17 1931  This chart was dictated using voice recognition software/Dragon. Despite best efforts to proofread, errors can occur which can change the meaning. Any change was purely unintentional.    Orvil Feil, PA-C 08/09/17 1943    Arnaldo Natal, MD 08/09/17 2139

## 2017-08-12 LAB — URINE CULTURE: Culture: 40000 — AB

## 2017-08-13 ENCOUNTER — Telehealth: Payer: Self-pay | Admitting: Pharmacist

## 2017-08-13 MED ORDER — AMOXICILLIN 500 MG PO CAPS: 500.0000 mg | ORAL_CAPSULE | Freq: Three times a day (TID) | ORAL | 0 refills | Status: AC

## 2017-08-13 NOTE — Progress Notes (Signed)
ED Antimicrobial Stewardship Positive Culture Follow Up   Gabrielle Harmon is an 32 y.o. female who presented to [facility] on 08/09/17 with a chief complaint of suprapubic pain. Urine culture resulted and growing 40,000 colonies enterococcus faecalis. Discharged on Keflex.   []  Treated with Keflex, organism resistant to prescribed antimicrobial  New antibiotic prescription: Amoxicillin 500mg  TID x 5 days  [facility] [address]  ED Provider: Dr. Willy EddyPatrick Robinson   Discussed with patient to stop Keflex and start new antibiotic, amoxicillin. Patient states she is in a lot of flank/back pain and is not feeling any better since she left the ED. Patient asks if she can get have pain medication called in and was advised to go see doctor or ED if she is in a lot of pain and/or the pain does not get better.   Recent Results (from the past 720 hour(s))  Urine culture     Status: Abnormal   Collection Time: 08/09/17  6:12 PM  Result Value Ref Range Status   Specimen Description   Final    URINE, RANDOM Performed at [facility], [address] [place], [place] [address]    Special Requests   Final    NONE Performed at [facility], [address] [place], [place] [address]    Culture 40,000 COLONIES/mL ENTEROCOCCUS FAECALIS (A)  Final   Report Status 08/12/2017 FINAL  Final   Organism ID, Bacteria ENTEROCOCCUS FAECALIS (A)  Final      Susceptibility   Enterococcus faecalis - MIC*    AMPICILLIN <=2 SENSITIVE Sensitive     LEVOFLOXACIN 1 SENSITIVE Sensitive     NITROFURANTOIN <=16 SENSITIVE Sensitive     VANCOMYCIN 1 SENSITIVE Sensitive     * 40,000 COLONIES/mL ENTEROCOCCUS FAECALIS    Gabrielle Harmon, PharmD Pharmacy Resident  08/13/2017, 4:35 PM

## 2017-09-04 ENCOUNTER — Other Ambulatory Visit: Payer: Self-pay | Admitting: Maternal Newborn

## 2017-10-09 ENCOUNTER — Other Ambulatory Visit: Payer: Self-pay | Admitting: Maternal Newborn

## 2017-10-10 ENCOUNTER — Other Ambulatory Visit: Payer: Self-pay | Admitting: Maternal Newborn

## 2017-11-24 ENCOUNTER — Other Ambulatory Visit: Payer: Self-pay | Admitting: Maternal Newborn

## 2017-12-31 ENCOUNTER — Other Ambulatory Visit: Payer: Self-pay | Admitting: Maternal Newborn

## 2018-04-27 ENCOUNTER — Other Ambulatory Visit: Payer: Self-pay | Admitting: Maternal Newborn

## 2019-02-13 ENCOUNTER — Other Ambulatory Visit: Payer: Self-pay

## 2019-02-13 ENCOUNTER — Emergency Department
Admission: EM | Admit: 2019-02-13 | Discharge: 2019-02-13 | Disposition: A | Discharge status: Home / Self Care | Payer: 59 | Attending: Emergency Medicine | Admitting: Emergency Medicine

## 2019-02-13 DIAGNOSIS — N939 Abnormal uterine and vaginal bleeding, unspecified: Secondary | ICD-10-CM | POA: Diagnosis not present

## 2019-02-13 DIAGNOSIS — F17210 Nicotine dependence, cigarettes, uncomplicated: Secondary | ICD-10-CM | POA: Diagnosis not present

## 2019-02-13 LAB — CBC
HCT: 42.6 % (ref 36.0–46.0)
Hemoglobin: 15.6 g/dL — ABNORMAL HIGH (ref 12.0–15.0)
MCH: 32.1 pg (ref 26.0–34.0)
MCHC: 36.6 g/dL — ABNORMAL HIGH (ref 30.0–36.0)
MCV: 87.7 fL (ref 80.0–100.0)
Platelets: 405 10*3/uL — ABNORMAL HIGH (ref 150–400)
RBC: 4.86 MIL/uL (ref 3.87–5.11)
RDW: 13.2 % (ref 11.5–15.5)
WBC: 8.3 10*3/uL (ref 4.0–10.5)
nRBC: 0 % (ref 0.0–0.2)

## 2019-02-13 LAB — POCT PREGNANCY, URINE: Preg Test, Ur: NEGATIVE

## 2019-02-13 LAB — BASIC METABOLIC PANEL
Anion gap: 13 (ref 5–15)
BUN: 10 mg/dL (ref 6–20)
CO2: 19 mmol/L — ABNORMAL LOW (ref 22–32)
Calcium: 8.9 mg/dL (ref 8.9–10.3)
Chloride: 106 mmol/L (ref 98–111)
Creatinine, Ser: 0.56 mg/dL (ref 0.44–1.00)
GFR calc Af Amer: >60 mL/min (ref >60)
GFR calc non Af Amer: >60 mL/min (ref >60)
Glucose, Bld: 97 mg/dL (ref 70–99)
Potassium: 2.9 mmol/L — ABNORMAL LOW (ref 3.5–5.1)
Sodium: 138 mmol/L (ref 135–145)

## 2019-02-13 LAB — HCG, QUANTITATIVE, PREGNANCY: hCG, Beta Chain, Quant, S: <1 m[IU]/mL (ref <5)

## 2019-02-13 NOTE — ED Notes (Signed)
Called for a room with no answer

## 2019-02-13 NOTE — ED Triage Notes (Signed)
Pt comes for vaginal bleeding with positive pregnancy in September. Pt started bleeding this morning. Pt also states that she has had lower back pain and abdominal cramping.

## 2019-02-13 NOTE — ED Provider Notes (Signed)
[facility] Emergency Department Provider Note  ____________________________________________  Time seen: Approximately 4:52 PM  The following is a medical screening exam note. It is intended that the patient await an ER room assignment for detailed exam, diagnosis, and disposition.  I have reviewed the triage vital signs.    HISTORY  Chief Complaint Vaginal Bleeding   HPI Gabrielle Harmon is a 33 y.o. female with past medical history of tubal ligation that presents to the emergency department for evaluation of left lower quadrant pain and vaginal bleeding today.  Patient states that her last menstrual period was in August.  She had a positive home pregnancy test in September.  Patient has 3 children.  She has not followed up with OB. No fever, vomiting.  Past Medical History:  Diagnosis Date  . Depression     Patient Active Problem List   Diagnosis Date Noted  . DUB (dysfunctional uterine bleeding) 05/07/2017  . Severe dysmenorrhea 01/10/2017  . Pelvic pain 01/10/2017    Past Surgical History:  Procedure Laterality Date  . adenocetomy    . ESOPHAGOGASTRODUODENOSCOPY ENDOSCOPY    . TUBAL LIGATION      Prior to Admission medications   Medication Sig Start Date End Date Taking? Authorizing Provider  citalopram (CELEXA) 40 MG tablet Take 40 mg by mouth every morning. 12/25/16   [provider]  cyclobenzaprine (FLEXERIL) 10 MG tablet TAKE 1 TABLET(10 MG) BY MOUTH AT BEDTIME 10/09/17   Oswaldo Conroy, CNM  cyclobenzaprine (FLEXERIL) 10 MG tablet TAKE 1 TABLET BY MOUTH AT BEDTIME 04/28/18   Oswaldo Conroy, CNM  IBU 600 MG tablet Take 1 tablet by mouth as needed. 12/25/16   [provider]  Norethindrone-Ethinyl Estradiol-Fe Biphas (LO LOESTRIN FE) 1 MG-10 MCG / 10 MCG tablet Take 1 tablet by mouth daily. Patient not taking: Reported on 05/07/2017 01/10/17   Oswaldo Conroy, CNM  Phendimetrazine Tartrate 35 MG TABS Take 1 tablet by  mouth daily. 12/27/16   [provider]  traMADol (ULTRAM) 50 MG tablet Take 1 tablet by mouth as needed. 12/18/16   [provider]    Allergies Patient has no known allergies.  Family History  Problem Relation Age of Onset  . Diabetes Paternal Aunt     Social History Social History   Tobacco Use  . Smoking status: Current Every Day Smoker    Types: Cigarettes  . Smokeless tobacco: Never Used  . Tobacco comment: 1/2 ppd  Substance Use Topics  . Alcohol use: No  . Drug use: No    Review of Systems Constitutional: Negative for fever. Gastrointestinal: Positive for abdominal pain.  No nausea, no vomiting.   Skin: Negative for rash/lesion/wound. Neurological: Negative for headaches  ____________________________________________   PHYSICAL EXAM:  VITAL SIGNS:  Today's Vitals   02/13/19 1631 02/13/19 1639  BP:  134/90  Pulse:  (!) 105  Resp:  17  Temp:  99.3 F (37.4 C)  TempSrc:  Oral  SpO2:  96%  Weight: 77.1 kg   Height: 5\' 3"  (1.6 m)   PainSc: 5     Body mass index is 30.11 kg/m.  Constitutional: Alert and oriented. No acute distress. Head: Atraumatic. Nose: No congestion/rhinnorhea. Mouth/Throat: Mucous membranes are moist. Neck: No stridor.  Cardiovascular: Good peripheral circulation. Respiratory: Normal respiratory effort. Musculoskeletal: No restriction Neurologic:  Normal speech and language. No gross focal neurologic deficits are appreciated. Speech is normal. No gait instability. Skin:  Skin is warm, dry and intact.  No rash noted. Psychiatric: Mood and affect are normal. Speech and behavior are normal.  ____________________________________________   LABS (all labs ordered are listed, but only abnormal results are displayed)  Labs Reviewed  CBC  BASIC METABOLIC PANEL  HCG, QUANTITATIVE, PREGNANCY  POCT PREGNANCY, URINE    ____________________________________________  ____________________________________________   INITIAL CLINICAL IMPRESSION(S)  Patient is overall well-appearing.  POC urine pregnancy is negative.  Beta hCG was ordered to confirm.  Basic labs were obtained.   Laban Emperor, PA-C 02/13/19 2050    Delman Kitten, MD 03/02/19 [phone]

## 2019-02-13 NOTE — ED Notes (Signed)
Pt had tubes tied. Pt preg test negative. Per Caryl Pina, [place], see bloodwork orders to confirm. Pt has not had a period since august.

## 2019-05-21 ENCOUNTER — Other Ambulatory Visit (HOSPITAL_COMMUNITY)
Admission: RE | Admit: 2019-05-21 | Discharge: 2019-05-21 | Disposition: A | Discharge status: Home / Self Care | Payer: 59 | Source: Ambulatory Visit | Attending: Obstetrics & Gynecology | Admitting: Obstetrics & Gynecology

## 2019-05-21 ENCOUNTER — Encounter: Payer: Self-pay | Admitting: Obstetrics & Gynecology

## 2019-05-21 ENCOUNTER — Ambulatory Visit (INDEPENDENT_AMBULATORY_CARE_PROVIDER_SITE_OTHER): Payer: 59 | Admitting: Obstetrics & Gynecology

## 2019-05-21 ENCOUNTER — Other Ambulatory Visit: Payer: Self-pay

## 2019-05-21 VITALS — BP 122/80 | Ht 64.0 in | Wt 182.0 lb

## 2019-05-21 DIAGNOSIS — Z124 Encounter for screening for malignant neoplasm of cervix: Secondary | ICD-10-CM | POA: Diagnosis not present

## 2019-05-21 DIAGNOSIS — R102 Pelvic and perineal pain: Secondary | ICD-10-CM

## 2019-05-21 NOTE — Progress Notes (Signed)
Gynecology Pelvic Pain Evaluation   Chief Complaint  Patient presents with  . Pelvic Pain    History of Present Illness:   Patient is a 34 y.o. [id number] who LMP was Patient's last menstrual period was 05/07/2019., presents today for a problem visit.  She complains of pain.   Her pain is localized to the deep pelvis area, described as constant, dull and aching, began  3 Weeks Ago and its severity is described as moderate. The pain radiates to the  Non-radiating. She has these associated symptoms which include none and denies n/v/d, fever, discharge, dyspareunia, abnormal bleeding as cycles are regular due next week. Patient has these modifiers which include relaxation that make it better and unable to associate with any factor that make it worse.  Context includes: spontaneous.  Prior US 2 years ago normal  PMHx: She  has a past medical history of Depression. Also,  has a past surgical history that includes Esophagogastroduodenoscopy endoscopy; Tubal ligation; and adenocetomy., family history includes Diabetes in her paternal aunt.,  reports that she has been smoking cigarettes. She has never used smokeless tobacco. She reports that she does not drink alcohol or use drugs.  She has a current medication list which includes the following prescription(s): citalopram, cyclobenzaprine, cyclobenzaprine, ibu, norethindrone-ethinyl estradiol-fe biphas, phendimetrazine tartrate, and tramadol. Also, has No Known Allergies.  Review of Systems  Constitutional: Negative for chills, fever and malaise/fatigue.  HENT: Negative for congestion, sinus pain and sore throat.   Eyes: Negative for blurred vision and pain.  Respiratory: Negative for cough and wheezing.   Cardiovascular: Negative for chest pain and leg swelling.  Gastrointestinal: Positive for abdominal pain. Negative for constipation, diarrhea, heartburn, nausea and vomiting.  Genitourinary: Negative for dysuria, frequency, hematuria and urgency.    Musculoskeletal: Negative for back pain, joint pain, myalgias and neck pain.  Skin: Negative for itching and rash.  Neurological: Negative for dizziness, tremors and weakness.  Endo/Heme/Allergies: Does not bruise/bleed easily.  Psychiatric/Behavioral: Negative for depression. The patient is not nervous/anxious and does not have insomnia.     Objective: BP 122/80   Ht 5\' 4"  (1.626 m)   Wt 182 lb (82.6 kg)   LMP 05/07/2019   Breastfeeding Unknown   BMI 31.24 kg/m  Physical Exam Constitutional:      General: She is not in acute distress.    Appearance: She is well-developed.  Genitourinary:     Pelvic exam was performed with patient supine.     Vagina and uterus normal.     No vaginal erythema or bleeding.     No cervical motion tenderness, discharge, polyp or nabothian cyst.     Uterus is mobile.     Uterus is not enlarged.     No uterine mass detected.    Uterus is midaxial.     No right or left adnexal mass present.     Right adnexa not tender.     Left adnexa not tender.     Genitourinary Comments: Slight fullness left adnexa No CMT  HENT:     Head: Normocephalic and atraumatic.     Nose: Nose normal.  Abdominal:     General: There is no distension.     Palpations: Abdomen is soft.     Tenderness: There is generalized abdominal tenderness. There is no guarding or rebound. Negative signs include Murphy's sign, Rovsing's sign and McBurney's sign.  Musculoskeletal:        General: Normal range of motion.  Neurological:  Mental Status: She is alert and oriented to person, place, and time.     Cranial Nerves: No cranial nerve deficit.  Skin:    General: Skin is warm and dry.  Psychiatric:        Attention and Perception: Attention normal.        Mood and Affect: Mood and affect normal.        Speech: Speech normal.        Behavior: Behavior normal.        Thought Content: Thought content normal.        Judgment: Judgment normal.    Female chaperone present  for pelvic portion of the physical exam  Assessment: 34 y.o. [id number] with pelvic pain.  1. Pelvic pain - NSAIDs - US PELVIC COMPLETE WITH TRANSVAGINAL; Future - Monitor for changes, pattern  A total of 30 minutes were spent face-to-face with the patient as well as preparation, review, communication, and documentation during this encounter.   Barnett Applebaum, MD, [facility], [facility] 05/21/2019  11:37 AM

## 2019-05-21 NOTE — Addendum Note (Signed)
Addended by: Nadara Mustard on: 05/21/2019 01:46 PM   Modules accepted: Orders

## 2019-05-22 ENCOUNTER — Other Ambulatory Visit: Payer: Self-pay | Admitting: Obstetrics & Gynecology

## 2019-05-22 LAB — CYTOLOGY - PAP
Chlamydia: NEGATIVE
Comment: NEGATIVE
Comment: NEGATIVE
Comment: NEGATIVE
Comment: NORMAL
Diagnosis: NEGATIVE
High risk HPV: NEGATIVE
Neisseria Gonorrhea: POSITIVE — AB
Trichomonas: NEGATIVE

## 2019-05-22 MED ORDER — CEFIXIME 400 MG PO CAPS: 800.0000 mg | ORAL_CAPSULE | Freq: Once | ORAL | 1 refills | Status: AC

## 2019-05-27 ENCOUNTER — Ambulatory Visit: Payer: 59

## 2019-05-27 ENCOUNTER — Ambulatory Visit: Payer: 59 | Admitting: Obstetrics & Gynecology

## 2019-06-18 ENCOUNTER — Ambulatory Visit: Payer: 59

## 2019-06-18 ENCOUNTER — Ambulatory Visit: Payer: 59 | Admitting: Obstetrics & Gynecology

## 2019-07-16 ENCOUNTER — Encounter: Payer: Self-pay | Admitting: Obstetrics & Gynecology

## 2019-07-16 ENCOUNTER — Ambulatory Visit (INDEPENDENT_AMBULATORY_CARE_PROVIDER_SITE_OTHER): Payer: Self-pay | Admitting: Obstetrics & Gynecology

## 2019-07-16 ENCOUNTER — Other Ambulatory Visit: Payer: Self-pay | Admitting: Obstetrics & Gynecology

## 2019-07-16 ENCOUNTER — Ambulatory Visit (INDEPENDENT_AMBULATORY_CARE_PROVIDER_SITE_OTHER): Payer: 59

## 2019-07-16 ENCOUNTER — Other Ambulatory Visit: Payer: Self-pay

## 2019-07-16 VITALS — BP 120/80 | Ht 64.0 in | Wt 185.0 lb

## 2019-07-16 DIAGNOSIS — R102 Pelvic and perineal pain: Secondary | ICD-10-CM

## 2019-07-16 NOTE — Progress Notes (Signed)
  HPI: Pt had been having some pelvic pain issues, and even findings on exam suggestive of a ovarian cyst or mass.  Since March, she has steadily improved and reports no pain today.  No irreg w cycles.  Has had tubal in past, not on hormones.  Ultrasound demonstrates no masses seen, no cysts These findings are normal  PMHx: She  has a past medical history of Depression. Also,  has a past surgical history that includes Esophagogastroduodenoscopy endoscopy; Tubal ligation; and adenocetomy., family history includes Diabetes in her paternal aunt.,  reports that she has been smoking cigarettes. She has never used smokeless tobacco. She reports that she does not drink alcohol or use drugs.  She has a current medication list which includes the following prescription(s): citalopram, ibu, phendimetrazine tartrate, and tramadol. Also, has No Known Allergies.  Review of Systems  All other systems reviewed and are negative.   Objective: BP 120/80   Ht 5\' 4"  (1.626 m)   Wt 185 lb (83.9 kg)   LMP 06/20/2019   BMI 31.76 kg/m   Physical examination Constitutional NAD, Conversant  Skin No rashes, lesions or ulceration.   Extremities: Moves all appropriately.  Normal ROM for age. No lymphadenopathy.  Neuro: Grossly intact  Psych: Oriented to PPT.  Normal mood. Normal affect.   08/20/2019 PELVIS TRANSVAGINAL NON-OB (TV ONLY)  Result Date: 07/16/2019 Patient Name: Gabrielle Harmon DOB: 01/19/1986 MRN: [id number] ULTRASOUND REPORT Location: [facility] Date of Service: 07/16/2019 Indications:Pelvic Pain Findings: The uterus is anteverted and measures 7.8 x 5.5 x 3.9 cm. Echo texture is homogenous without evidence of focal masses. The Endometrium measures 9.6 mm. Right Ovary measures 2.4 x 1.7 x 1.8 cm. It is normal in appearance. Left Ovary measures 3.6 x 2.7 x 2.0 cm. It is normal in appearance. Survey of the adnexa demonstrates no adnexal masses. There is no free fluid in the cul de sac. Impression: 1. Normal  pelvic ultrasound. Recommendations: 1.Clinical correlation with the patient's History and Physical Exam. 09/15/2019, RT Review of ULTRASOUND.    I have personally reviewed images and report of recent ultrasound done at [facility].    Plan of management to be discussed with patient. [facility], MD, FACOG [facility], [facility] 07/16/2019  10:39 AM   Assessment:  Pelvic pain Resolved Discissed options for monitoring for recurrent pain; possible cyst in March that has resolved, only would treat if persistent or recurrent.   Pt reassured by todays exam and counseling  A total of 20 minutes were spent face-to-face with the patient as well as preparation, review, communication, and documentation during this encounter.   April, MD, [facility], [facility] 07/16/2019  10:46 AM

## 2020-05-19 ENCOUNTER — Emergency Department
Admission: EM | Admit: 2020-05-19 | Discharge: 2020-05-19 | Disposition: A | Discharge status: Home / Self Care | Payer: PRIVATE HEALTH INSURANCE | Attending: Student in an Organized Health Care Education/Training Program | Admitting: Student in an Organized Health Care Education/Training Program

## 2020-05-19 ENCOUNTER — Other Ambulatory Visit: Payer: Self-pay

## 2020-05-19 ENCOUNTER — Emergency Department: Payer: PRIVATE HEALTH INSURANCE

## 2020-05-19 ENCOUNTER — Encounter: Payer: Self-pay | Admitting: Emergency Medicine

## 2020-05-19 DIAGNOSIS — R079 Chest pain, unspecified: Secondary | ICD-10-CM | POA: Diagnosis present

## 2020-05-19 DIAGNOSIS — R0789 Other chest pain: Secondary | ICD-10-CM | POA: Insufficient documentation

## 2020-05-19 DIAGNOSIS — F17210 Nicotine dependence, cigarettes, uncomplicated: Secondary | ICD-10-CM | POA: Insufficient documentation

## 2020-05-19 DIAGNOSIS — R0602 Shortness of breath: Secondary | ICD-10-CM | POA: Insufficient documentation

## 2020-05-19 LAB — CBC
HCT: 39.3 % (ref 36.0–46.0)
Hemoglobin: 13.5 g/dL (ref 12.0–15.0)
MCH: 30.6 pg (ref 26.0–34.0)
MCHC: 34.4 g/dL (ref 30.0–36.0)
MCV: 89.1 fL (ref 80.0–100.0)
Platelets: 320 10*3/uL (ref 150–400)
RBC: 4.41 MIL/uL (ref 3.87–5.11)
RDW: 13.9 % (ref 11.5–15.5)
WBC: 11.2 10*3/uL — ABNORMAL HIGH (ref 4.0–10.5)
nRBC: 0 % (ref 0.0–0.2)

## 2020-05-19 LAB — COMPREHENSIVE METABOLIC PANEL
ALT: 19 U/L (ref 0–44)
AST: 17 U/L (ref 15–41)
Albumin: 4 g/dL (ref 3.5–5.0)
Alkaline Phosphatase: 42 U/L (ref 38–126)
Anion gap: 9 (ref 5–15)
BUN: 15 mg/dL (ref 6–20)
CO2: 17 mmol/L — ABNORMAL LOW (ref 22–32)
Calcium: 8.8 mg/dL — ABNORMAL LOW (ref 8.9–10.3)
Chloride: 108 mmol/L (ref 98–111)
Creatinine, Ser: 0.5 mg/dL (ref 0.44–1.00)
GFR, Estimated: >60 mL/min (ref >60)
Glucose, Bld: 88 mg/dL (ref 70–99)
Potassium: 3.6 mmol/L (ref 3.5–5.1)
Sodium: 134 mmol/L — ABNORMAL LOW (ref 135–145)
Total Bilirubin: 0.7 mg/dL (ref 0.3–1.2)
Total Protein: 6.7 g/dL (ref 6.5–8.1)

## 2020-05-19 LAB — HCG, QUANTITATIVE, PREGNANCY: hCG, Beta Chain, Quant, S: <1 m[IU]/mL (ref <5)

## 2020-05-19 LAB — TROPONIN I (HIGH SENSITIVITY)
Troponin I (High Sensitivity): <2 ng/L (ref <18)
Troponin I (High Sensitivity): 3 ng/L (ref <18)

## 2020-05-19 MED ORDER — KETOROLAC TROMETHAMINE 30 MG/ML IJ SOLN
15.0000 mg | Freq: Once | INTRAMUSCULAR | Status: AC
  Administered 2020-05-19: 15 mg via INTRAVENOUS
  Filled 2020-05-19: qty 1

## 2020-05-19 MED ORDER — CYCLOBENZAPRINE HCL 5 MG PO TABS: 5.0000 mg | ORAL_TABLET | Freq: Three times a day (TID) | ORAL | 0 refills | Status: DC | PRN

## 2020-05-19 MED ORDER — IOHEXOL 350 MG/ML SOLN
75.0000 mL | Freq: Once | INTRAVENOUS | Status: AC | PRN
  Administered 2020-05-19: 75 mL via INTRAVENOUS

## 2020-05-19 MED ORDER — OXYCODONE-ACETAMINOPHEN 5-325 MG PO TABS
1.0000 | ORAL_TABLET | Freq: Once | ORAL | Status: AC
  Administered 2020-05-19: 1 via ORAL
  Filled 2020-05-19: qty 1

## 2020-05-19 NOTE — ED Triage Notes (Signed)
Pt to triage via w/c with no distress noted; pt reports since about 130am having rt upper CP radiating into back with no accomp symptoms; denies hx of same

## 2020-05-19 NOTE — ED Provider Notes (Signed)
[facility] Emergency Department Provider Note    Event Date/Time   First MD Initiated Contact with Patient 05/19/20 0350     (approximate)  I have reviewed the triage vital signs and the nursing notes.   HISTORY  Chief Complaint Chest Pain    HPI Gabrielle Harmon is a 35 y.o. female below listed past medical history presents to the ER for evaluation of sudden onset right upper chest pain worsened with movement.  Some mild associated shortness of breath but no cough no fevers.  Never had pain like this before.  Pain is worse with movement of the right upper extremity.  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  No numbness or tingling.    Past Medical History:  Diagnosis Date  . Depression    Family History  Problem Relation Age of Onset  . Diabetes Paternal Aunt    Past Surgical History:  Procedure Laterality Date  . adenocetomy    . ESOPHAGOGASTRODUODENOSCOPY ENDOSCOPY    . TUBAL LIGATION     Patient Active Problem List   Diagnosis Date Noted  . DUB (dysfunctional uterine bleeding) 05/07/2017  . Severe dysmenorrhea 01/10/2017  . Pelvic pain 01/10/2017      Prior to Admission medications   Medication Sig Start Date End Date Taking? Authorizing Provider  cyclobenzaprine (FLEXERIL) 5 MG tablet Take 1 tablet (5 mg total) by mouth 3 (three) times daily as needed for muscle spasms. 05/19/20  Yes Willy Eddy, MD  citalopram (CELEXA) 40 MG tablet Take 40 mg by mouth every morning. 12/25/16   [provider]  IBU 600 MG tablet Take 1 tablet by mouth as needed. 12/25/16   [provider]  Phendimetrazine Tartrate 35 MG TABS Take 1 tablet by mouth daily. 12/27/16   [provider]  traMADol (ULTRAM) 50 MG tablet Take 1 tablet by mouth as needed. 12/18/16   [provider]    Allergies Patient has no known allergies.    Social History Social History   Tobacco Use   . Smoking status: Current Every Day Smoker    Types: Cigarettes  . Smokeless tobacco: Never Used  . Tobacco comment: 1/2 ppd  Vaping Use  . Vaping Use: Never used  Substance Use Topics  . Alcohol use: No  . Drug use: No    Review of Systems Patient denies headaches, rhinorrhea, blurry vision, numbness, shortness of breath, chest pain, edema, cough, abdominal pain, nausea, vomiting, diarrhea, dysuria, fevers, rashes or hallucinations unless otherwise stated above in HPI. ____________________________________________   PHYSICAL EXAM:  VITAL SIGNS: Vitals:   05/19/20 0430 05/19/20 0630  BP: 107/69   Pulse: 86 79  Resp: (!) 23 16  Temp:    SpO2: 98% 92%    Constitutional: Alert and oriented.  Eyes: Conjunctivae are normal.  Head: Atraumatic. Nose: No congestion/rhinnorhea. Mouth/Throat: Mucous membranes are moist.   Neck: No stridor. Painless ROM.  Cardiovascular: Normal rate, regular rhythm. Grossly normal heart sounds.  Good peripheral circulation. Respiratory: Normal respiratory effort.  No retractions. Lungs CTAB. Gastrointestinal: Soft and nontender. No distention. No abdominal bruits. No CVA tenderness. Genitourinary:  Musculoskeletal: Pain is reproduced with movement of the right shoulder but no overlying erythema or effusion.  No lower extremity tenderness nor edema.  No joint effusions. Neurologic:  Normal speech and language. No gross focal neurologic deficits are appreciated. No facial droop Skin:  Skin is warm, dry and intact. No rash noted.  Psychiatric: Mood and affect are normal. Speech and behavior are normal.  ____________________________________________   LABS (all labs ordered are listed, but only abnormal results are displayed)  Results for orders placed or performed during the hospital encounter of 05/19/20 (from the past 24 hour(s))  CBC     Status: Abnormal   Collection Time: 05/19/20  4:12 AM  Result Value Ref Range   WBC 11.2 (H) 4.0 - 10.5  K/uL   RBC 4.41 3.87 - 5.11 MIL/uL   Hemoglobin 13.5 12.0 - 15.0 g/dL   HCT 83.1 51.7 - 61.6 %   MCV 89.1 80.0 - 100.0 fL   MCH 30.6 26.0 - 34.0 pg   MCHC 34.4 30.0 - 36.0 g/dL   RDW 07.3 71.0 - 62.6 %   Platelets 320 150 - 400 K/uL   nRBC 0.0 0.0 - 0.2 %  Comprehensive metabolic panel     Status: Abnormal   Collection Time: 05/19/20  4:12 AM  Result Value Ref Range   Sodium 134 (L) 135 - 145 mmol/L   Potassium 3.6 3.5 - 5.1 mmol/L   Chloride 108 98 - 111 mmol/L   CO2 17 (L) 22 - 32 mmol/L   Glucose, Bld 88 70 - 99 mg/dL   BUN 15 6 - 20 mg/dL   Creatinine, Ser 9.48 0.44 - 1.00 mg/dL   Calcium 8.8 (L) 8.9 - 10.3 mg/dL   Total Protein 6.7 6.5 - 8.1 g/dL   Albumin 4.0 3.5 - 5.0 g/dL   AST 17 15 - 41 U/L   ALT 19 0 - 44 U/L   Alkaline Phosphatase 42 38 - 126 U/L   Total Bilirubin 0.7 0.3 - 1.2 mg/dL   GFR, Estimated >54 >62 mL/min   Anion gap 9 5 - 15  Troponin I (High Sensitivity)     Status: None   Collection Time: 05/19/20  4:12 AM  Result Value Ref Range   Troponin I (High Sensitivity) <2 <18 ng/L  hCG, quantitative, pregnancy     Status: None   Collection Time: 05/19/20  4:12 AM  Result Value Ref Range   hCG, Beta Chain, Quant, S <1 <5 mIU/mL  Troponin I (High Sensitivity)     Status: None   Collection Time: 05/19/20  5:59 AM  Result Value Ref Range   Troponin I (High Sensitivity) 3 <18 ng/L   ____________________________________________  EKG My review and personal interpretation at Time: 3:44   Indication: chestp ain  Rate: 95  Rhythm: sinus Axis: normal Other: normal intervals, no stemi, no depressions ____________________________________________  RADIOLOGY  I personally reviewed all radiographic images ordered to evaluate for the above acute complaints and reviewed radiology reports and findings.  These findings were personally discussed with the patient.  Please see medical record for radiology  report.  ____________________________________________   PROCEDURES  Procedure(s) performed:  Procedures    Critical Care performed: no ____________________________________________   INITIAL IMPRESSION / ASSESSMENT AND PLAN / ED COURSE  Pertinent labs & imaging results that were available during my care of the patient were reviewed by me and considered in my medical decision making (see chart for details).   DDX: ACS, pericarditis, esophagitis, boerhaaves, pe, dissection, pna, bronchitis, costochondritis   Celena Lanius Dickerson is a 35 y.o. who presents to the ED with chest discomfort and right shoulder pain as described above.  EKG is nonischemic.  Seems atypical for ACS.  Low risk by HEART score, patient young but she is a smoker.  I do  not appreciate any significant wheezing or respiratory symptoms by history and exam.  Chest x-ray does show evidence of possible nodule will order CT will evaluate for PE to further characterize.  Her abdominal exam is soft and benign.  Does not appear to be referred pain from the abdomen.  Clinical Course as of 05/19/20 0658  Thu May 19, 2020  0641 Patient reassessed.  Remains well-appearing.  Discussed CT imaging findings as well as findings of early emphysema.  She is not having any hypoxia symptoms of shortness of breath right now.  Still waiting on the repeat troponin.  I think this is more likely musculoskeletal strain discomfort. [PR]  0656 Repeat troponin again negative.  Splendidly she stable and appropriate for outpatient follow-up.  Will also give referral to pulmonary nodule clinic.  Patient agreeable to plan. [PR]    Clinical Course User Index [PR] Willy Eddy, MD    The patient was evaluated in Emergency Department today for the symptoms described in the history of present illness. He/she was evaluated in the context of the global COVID-19 pandemic, which necessitated consideration that the patient might be at risk for infection with  the SARS-CoV-2 virus that causes COVID-19. Institutional protocols and algorithms that pertain to the evaluation of patients at risk for COVID-19 are in a state of rapid change based on information released by regulatory bodies including the CDC and federal and state organizations. These policies and algorithms were followed during the patient's care in the ED.  As part of my medical decision making, I reviewed the following data within the electronic MEDICAL RECORD NUMBER Nursing notes reviewed and incorporated, Labs reviewed, notes from prior ED visits and [place] Controlled Substance Database   ____________________________________________   FINAL CLINICAL IMPRESSION(S) / ED DIAGNOSES  Final diagnoses:  Atypical chest pain      NEW MEDICATIONS STARTED DURING THIS VISIT:  New Prescriptions   CYCLOBENZAPRINE (FLEXERIL) 5 MG TABLET    Take 1 tablet (5 mg total) by mouth 3 (three) times daily as needed for muscle spasms.     Note:  This document was prepared using Dragon voice recognition software and may include unintentional dictation errors.    Willy Eddy, MD 05/19/20 [phone]

## 2020-06-09 ENCOUNTER — Other Ambulatory Visit: Payer: Self-pay | Admitting: Oncology

## 2020-06-09 DIAGNOSIS — R911 Solitary pulmonary nodule: Secondary | ICD-10-CM

## 2020-06-09 NOTE — Progress Notes (Signed)
  Pulmonary Nodule Clinic Telephone Note [facility]   Received referral from [facility] ED-Dr. Roxan Hockey  HPI: Mrs. Braxton is a 35 year old female with past medical history significant for pelvic pain and dysmenorrhagia who presented recently to the emergency room for chest pain.  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  Radiology recommended a follow-up in 3 to 6 months.  Review and Recommendations: I personally reviewed all patient's previous imaging including most CTA.  Recommend a noncontrast chest CT in 3 months.  Social History: Tobacco Use: High Risk  . Smoking Tobacco Use: Current Every Day Smoker  . Smokeless Tobacco Use: Never Used     High risk factors include: History of heavy smoking, exposure to asbestos, radium or uranium, personal family history of lung cancer, older age, sex (females greater than males), race (black and native [place] greater than weight), marginal speculation, upper lobe location, multiplicity (less than 5 nodules increases risk for malignancy) and emphysema and/or pulmonary fibrosis.   This recommendation follows the consensus statement: Guidelines for Management of Incidental Pulmonary Nodules Detected on CT Images: From the Fleischner Society 2017; Radiology 2017; 284:228-243.    I have placed order for CT scan without contrast to be completed in approximately 3 months.  Disposition: Order placed for repeat CT chest. Will notify Micael Hampshire in scheduling. [address] to call patient with appointment date and time. Return to pulmonary nodule clinic a few days after his repeat imaging to discuss results and plan moving forward.  Durenda Hurt, NP 06/09/2020 12:22 PM

## 2020-06-16 ENCOUNTER — Encounter: Payer: Self-pay | Admitting: *Deleted

## 2020-08-19 ENCOUNTER — Ambulatory Visit: Payer: PRIVATE HEALTH INSURANCE | Attending: Oncology

## 2020-08-22 ENCOUNTER — Inpatient Hospital Stay: Payer: PRIVATE HEALTH INSURANCE | Admitting: Oncology

## 2020-09-14 ENCOUNTER — Telehealth: Payer: Self-pay | Admitting: *Deleted

## 2020-09-14 NOTE — Telephone Encounter (Signed)
Referral receiving from ED provider to follow up with patient regarding lung nodule. Phone call made to patient to schedule follow up visits in the lung nodule clinic. Message left for pt to call back.

## 2020-12-21 ENCOUNTER — Encounter: Payer: Self-pay | Admitting: *Deleted

## 2020-12-21 NOTE — Progress Notes (Signed)
Reminder letter sent to patient requesting to call back to schedule follow up appts in the lung nodule clinic. Awaiting call back. 

## 2021-10-13 ENCOUNTER — Emergency Department: Payer: BC Managed Care – PPO

## 2021-10-13 ENCOUNTER — Observation Stay
Admission: EM | Admit: 2021-10-13 | Discharge: 2021-10-14 | Disposition: A | Discharge status: Home / Self Care | Payer: BC Managed Care – PPO | Attending: Hospitalist | Admitting: Hospitalist

## 2021-10-13 ENCOUNTER — Other Ambulatory Visit: Payer: Self-pay

## 2021-10-13 DIAGNOSIS — R911 Solitary pulmonary nodule: Secondary | ICD-10-CM | POA: Diagnosis not present

## 2021-10-13 DIAGNOSIS — Z20822 Contact with and (suspected) exposure to covid-19: Secondary | ICD-10-CM | POA: Diagnosis not present

## 2021-10-13 DIAGNOSIS — J029 Acute pharyngitis, unspecified: Secondary | ICD-10-CM

## 2021-10-13 DIAGNOSIS — J449 Chronic obstructive pulmonary disease, unspecified: Secondary | ICD-10-CM | POA: Insufficient documentation

## 2021-10-13 DIAGNOSIS — F17210 Nicotine dependence, cigarettes, uncomplicated: Secondary | ICD-10-CM | POA: Diagnosis not present

## 2021-10-13 DIAGNOSIS — Z6841 Body Mass Index (BMI) 40.0 and over, adult: Secondary | ICD-10-CM | POA: Diagnosis not present

## 2021-10-13 DIAGNOSIS — J0430 Supraglottitis, unspecified, without obstruction: Secondary | ICD-10-CM | POA: Diagnosis not present

## 2021-10-13 DIAGNOSIS — Z79899 Other long term (current) drug therapy: Secondary | ICD-10-CM | POA: Diagnosis not present

## 2021-10-13 DIAGNOSIS — E663 Overweight: Secondary | ICD-10-CM | POA: Insufficient documentation

## 2021-10-13 DIAGNOSIS — IMO0001 Reserved for inherently not codable concepts without codable children: Secondary | ICD-10-CM | POA: Diagnosis present

## 2021-10-13 DIAGNOSIS — F17200 Nicotine dependence, unspecified, uncomplicated: Secondary | ICD-10-CM | POA: Diagnosis present

## 2021-10-13 LAB — BASIC METABOLIC PANEL
Anion gap: 11 (ref 5–15)
BUN: 12 mg/dL (ref 6–20)
CO2: 19 mmol/L — ABNORMAL LOW (ref 22–32)
Calcium: 8.8 mg/dL — ABNORMAL LOW (ref 8.9–10.3)
Chloride: 107 mmol/L (ref 98–111)
Creatinine, Ser: 0.73 mg/dL (ref 0.44–1.00)
GFR, Estimated: >60 mL/min (ref >60)
Glucose, Bld: 101 mg/dL — ABNORMAL HIGH (ref 70–99)
Potassium: 3.3 mmol/L — ABNORMAL LOW (ref 3.5–5.1)
Sodium: 137 mmol/L (ref 135–145)

## 2021-10-13 LAB — CBC WITH DIFFERENTIAL/PLATELET
Abs Immature Granulocytes: 0.18 10*3/uL — ABNORMAL HIGH (ref 0.00–0.07)
Basophils Absolute: 0.1 10*3/uL (ref 0.0–0.1)
Basophils Relative: 0 %
Eosinophils Absolute: 0.2 10*3/uL (ref 0.0–0.5)
Eosinophils Relative: 1 %
HCT: 37.5 % (ref 36.0–46.0)
Hemoglobin: 12.6 g/dL (ref 12.0–15.0)
Immature Granulocytes: 1 %
Lymphocytes Relative: 9 %
Lymphs Abs: 1.9 10*3/uL (ref 0.7–4.0)
MCH: 28.3 pg (ref 26.0–34.0)
MCHC: 33.6 g/dL (ref 30.0–36.0)
MCV: 84.3 fL (ref 80.0–100.0)
Monocytes Absolute: 1.1 10*3/uL — ABNORMAL HIGH (ref 0.1–1.0)
Monocytes Relative: 5 %
Neutro Abs: 18.1 10*3/uL — ABNORMAL HIGH (ref 1.7–7.7)
Neutrophils Relative %: 84 %
Platelets: 353 10*3/uL (ref 150–400)
RBC: 4.45 MIL/uL (ref 3.87–5.11)
RDW: 14.9 % (ref 11.5–15.5)
WBC: 21.6 10*3/uL — ABNORMAL HIGH (ref 4.0–10.5)
nRBC: 0 % (ref 0.0–0.2)

## 2021-10-13 LAB — POC URINE PREG, ED: Preg Test, Ur: NEGATIVE

## 2021-10-13 LAB — SARS CORONAVIRUS 2 BY RT PCR: SARS Coronavirus 2 by RT PCR: NEGATIVE

## 2021-10-13 LAB — GROUP A STREP BY PCR: Group A Strep by PCR: NOT DETECTED

## 2021-10-13 MED ORDER — ONDANSETRON HCL 4 MG PO TABS: 4.0000 mg | ORAL_TABLET | Freq: Four times a day (QID) | ORAL | Status: DC | PRN

## 2021-10-13 MED ORDER — BISACODYL 5 MG PO TBEC: 5.0000 mg | DELAYED_RELEASE_TABLET | Freq: Every day | ORAL | Status: DC | PRN

## 2021-10-13 MED ORDER — AMPICILLIN-SULBACTAM SODIUM 3 (2-1) G IJ SOLR
3.0000 g | Freq: Once | INTRAMUSCULAR | Status: AC
Start: 2021-10-13 — End: 2021-10-13
  Administered 2021-10-13: 3 g via INTRAVENOUS
  Filled 2021-10-13: qty 8

## 2021-10-13 MED ORDER — ONDANSETRON HCL 4 MG/2ML IJ SOLN
4.0000 mg | Freq: Four times a day (QID) | INTRAMUSCULAR | Status: DC | PRN
  Administered 2021-10-14: 4 mg via INTRAVENOUS
  Filled 2021-10-13: qty 2

## 2021-10-13 MED ORDER — METHYLPREDNISOLONE SODIUM SUCC 125 MG IJ SOLR
100.0000 mg | Freq: Two times a day (BID) | INTRAMUSCULAR | Status: DC
  Administered 2021-10-14: 100 mg via INTRAVENOUS
  Filled 2021-10-13: qty 2

## 2021-10-13 MED ORDER — LIDOCAINE VISCOUS HCL 2 % MT SOLN
15.0000 mL | Freq: Once | OROMUCOSAL | Status: DC
  Filled 2021-10-13: qty 15

## 2021-10-13 MED ORDER — LACTATED RINGERS IV SOLN
INTRAVENOUS | Status: DC
Start: 2021-10-13 — End: 2021-10-14

## 2021-10-13 MED ORDER — NICOTINE 14 MG/24HR TD PT24
14.0000 mg | MEDICATED_PATCH | Freq: Every day | TRANSDERMAL | Status: DC
  Administered 2021-10-13 – 2021-10-14 (×2): 14 mg via TRANSDERMAL
  Filled 2021-10-13 (×2): qty 1

## 2021-10-13 MED ORDER — ACETAMINOPHEN 325 MG RE SUPP: 650.0000 mg | Freq: Four times a day (QID) | RECTAL | Status: DC | PRN

## 2021-10-13 MED ORDER — ALBUTEROL SULFATE (2.5 MG/3ML) 0.083% IN NEBU: 2.5000 mg | INHALATION_SOLUTION | RESPIRATORY_TRACT | Status: DC | PRN

## 2021-10-13 MED ORDER — KETOROLAC TROMETHAMINE 30 MG/ML IJ SOLN: 30.0000 mg | Freq: Four times a day (QID) | INTRAMUSCULAR | Status: DC | PRN

## 2021-10-13 MED ORDER — POLYETHYLENE GLYCOL 3350 17 G PO PACK: 17.0000 g | PACK | Freq: Every day | ORAL | Status: DC | PRN

## 2021-10-13 MED ORDER — SODIUM CHLORIDE 0.9% FLUSH
3.0000 mL | Freq: Two times a day (BID) | INTRAVENOUS | Status: DC
  Administered 2021-10-13 – 2021-10-14 (×2): 3 mL via INTRAVENOUS

## 2021-10-13 MED ORDER — HYDRALAZINE HCL 20 MG/ML IJ SOLN: 5.0000 mg | INTRAMUSCULAR | Status: DC | PRN

## 2021-10-13 MED ORDER — KETOROLAC TROMETHAMINE 30 MG/ML IJ SOLN
15.0000 mg | Freq: Once | INTRAMUSCULAR | Status: AC
  Administered 2021-10-13: 15 mg via INTRAVENOUS
  Filled 2021-10-13: qty 1

## 2021-10-13 MED ORDER — IOHEXOL 300 MG/ML  SOLN
75.0000 mL | Freq: Once | INTRAMUSCULAR | Status: AC | PRN
  Administered 2021-10-13: 75 mL via INTRAVENOUS

## 2021-10-13 MED ORDER — METHYLPREDNISOLONE SODIUM SUCC 125 MG IJ SOLR
125.0000 mg | Freq: Once | INTRAMUSCULAR | Status: AC
  Administered 2021-10-13: 125 mg via INTRAMUSCULAR
  Filled 2021-10-13: qty 2

## 2021-10-13 MED ORDER — MORPHINE SULFATE (PF) 2 MG/ML IV SOLN
2.0000 mg | INTRAVENOUS | Status: DC | PRN
  Administered 2021-10-14 (×3): 2 mg via INTRAVENOUS
  Filled 2021-10-13 (×3): qty 1

## 2021-10-13 MED ORDER — POTASSIUM CHLORIDE 10 MEQ/100ML IV SOLN
10.0000 meq | INTRAVENOUS | Status: AC
  Administered 2021-10-14 (×4): 10 meq via INTRAVENOUS
  Filled 2021-10-13 (×2): qty 100

## 2021-10-13 MED ORDER — DEXAMETHASONE SODIUM PHOSPHATE 10 MG/ML IJ SOLN
6.0000 mg | Freq: Once | INTRAMUSCULAR | Status: AC
  Administered 2021-10-13: 6 mg via INTRAVENOUS
  Filled 2021-10-13: qty 1

## 2021-10-13 MED ORDER — ACETAMINOPHEN 325 MG PO TABS: 650.0000 mg | ORAL_TABLET | Freq: Four times a day (QID) | ORAL | Status: DC | PRN

## 2021-10-13 MED ORDER — SODIUM CHLORIDE 0.9 % IV SOLN
3.0000 g | Freq: Four times a day (QID) | INTRAVENOUS | Status: DC
  Administered 2021-10-14 (×2): 3 g via INTRAVENOUS
  Filled 2021-10-13 (×2): qty 8

## 2021-10-13 MED ORDER — ENOXAPARIN SODIUM 40 MG/0.4ML IJ SOSY
40.0000 mg | PREFILLED_SYRINGE | INTRAMUSCULAR | Status: DC
  Administered 2021-10-13: 40 mg via SUBCUTANEOUS
  Filled 2021-10-13: qty 0.4

## 2021-10-13 NOTE — ED Provider Triage Note (Signed)
Emergency Medicine Provider Triage Evaluation Note  Gabrielle Harmon , a 36 y.o. female  was evaluated in triage.  Pt complains of sore throat.  Review of Systems  Positive: Sore throat Negative: Fever  Physical Exam  Ht 5\' 4"  (1.626 m)   Wt 72.6 kg   LMP 09/22/2021 (Approximate)   BMI 27.46 kg/m  Gen:   Awake, no distress   Resp:  Normal effort  MSK:   Moves extremities without difficulty  Other:    Medical Decision Making  Medically screening exam initiated at 4:43 PM.  Appropriate orders placed.  Gabrielle Harmon was informed that the remainder of the evaluation will be completed by another provider, this initial triage assessment does not replace that evaluation, and the importance of remaining in the ED until their evaluation is complete.  Strep test and COVID/flu test today   [name], PA-C 10/13/21 1643

## 2021-10-13 NOTE — Consult Note (Signed)
Gabrielle, Harmon [id number] [birth year] [name], MD  Reason for Consult: Throat pain Requesting Physician: Gabrielle Cheek, MD Consulting Physician: [name]  HPI: This 36 y.o. year old female was admitted on 10/13/2021 for Sore Throat. Gabrielle Harmon is a 36 y.o. female presents to the emergency department for treatment and evaluation of sore throat.  Upon awakening this morning she had a very sore throat and had difficulty swallowing due to the pain.  No fever.  No nausea, vomiting, diarrhea, cough, or other symptoms of concern.  She also has a small, tender, raised area on the left side of her neck just under her hairline, present since June.  No previous skin infections.  CT suggested supraglottitis.   Allergies: No Known Allergies  Medications: (Not in a hospital admission) .  Current Facility-Administered Medications  Medication Dose Route Frequency Provider Last Rate Last Admin   lidocaine (XYLOCAINE) 2 % viscous mouth solution 15 mL  15 mL Mouth/Throat Once Sherrie Mustache Roselyn Bering, PA-C       Current Outpatient Medications  Medication Sig Dispense Refill   citalopram (CELEXA) 40 MG tablet Take 40 mg by mouth every morning.  0   cyclobenzaprine (FLEXERIL) 5 MG tablet Take 1 tablet (5 mg total) by mouth 3 (three) times daily as needed for muscle spasms. 12 tablet 0   IBU 600 MG tablet Take 1 tablet by mouth as needed.  0   Phendimetrazine Tartrate 35 MG TABS Take 1 tablet by mouth daily.  0   traMADol (ULTRAM) 50 MG tablet Take 1 tablet by mouth as needed.  0    PMH:  Past Medical History:  Diagnosis Date   Depression     Fam Hx:  Family History  Problem Relation Age of Onset   Diabetes Paternal Aunt     Soc Hx:  Social History   Socioeconomic History   Marital status: Married    Spouse name: Not on file   Number of children: 3   Years of education: Not on file   Highest education level: Not on file  Occupational History   Not on file  Tobacco Use   Smoking  status: Every Day    Types: Cigarettes   Smokeless tobacco: Never   Tobacco comments:    1/2 ppd  Vaping Use   Vaping Use: Never used  Substance and Sexual Activity   Alcohol use: No   Drug use: No   Sexual activity: Yes    Birth control/protection: Surgical  Other Topics Concern   Not on file  Social History Narrative   Not on file   Social Determinants of Health   Financial Resource Strain: Not on file  Food Insecurity: Not on file  Transportation Needs: Not on file  Physical Activity: Not on file  Stress: Not on file  Social Connections: Not on file  Intimate Partner Violence: Not on file    PSH:  Past Surgical History:  Procedure Laterality Date   adenocetomy     ESOPHAGOGASTRODUODENOSCOPY ENDOSCOPY     TUBAL LIGATION    . Procedures since admission: No admission procedures for hospital encounter.  ROS: Review of systems normal other than 12 systems except per HPI.  PHYSICAL EXAM Vitals:  Vitals:   10/13/21 1644 10/13/21 1904  BP: 114/73 103/63  Pulse: 97 (!) 101  Resp: 16 18  Temp: 98.6 F (37 C)   SpO2: 99% 96%  . General: Well-developed, Well-nourished in no acute distress Mood: Mood and affect well  adjusted, pleasant and cooperative. Orientation: Grossly alert and oriented. Vocal Quality: No hoarseness. Communicates verbally. No stridor.  head and Face: NCAT. No facial asymmetry. No visible skin lesions. No significant facial scars. No tenderness with sinus percussion. Facial strength normal and symmetric. Ears: External ears with normal landmarks, no lesions. External auditory canals free of infection, cerumen impaction or lesions. Tympanic membranes intact with good landmarks and normal mobility on pneumatic otoscopy. No middle ear effusion. Hearing: Speech reception grossly normal. Nose: External nose normal with midline dorsum and no lesions or deformity. Nasal Cavity reveals essentially midline septum with normal inferior turbinates. No significant  mucosal congestion or erythema. Nasal secretions are minimal and clear. No polyps seen on anterior rhinoscopy. Oral Cavity/ Oropharynx: Lips are normal with no lesions. Teeth no frank dental caries. Gingiva healthy with no lesions or gingivitis. Oropharynx including tongue, buccal mucosa, floor of mouth, hard and soft palate, uvula and posterior pharynx free of exudates, erythema or lesions with normal symmetry and hydration.  Indirect Laryngoscopy/Nasopharyngoscopy: Visualization of the larynx, hypopharynx and nasopharynx is not possible in this setting with routine examination. Neck: Supple and symmetric with no palpable masses, tenderness or crepitance. 2 cm left posterior neck sebaceous cyst. The trachea is midline. Thyroid gland is soft, nontender and symmetric with no masses or enlargement. Parotid and submandibular glands are soft, nontender and symmetric, without masses. Lymphatic: Cervical lymph nodes tender in JD region bilaterally without bulky adenopathy. Respiratory: Normal respiratory effort without labored breathing.  No stridor Neurologic: Cranial Nerves II through XII are grossly intact. Eyes: Gaze and Ocular Motility are grossly normal. PERRLA. No visible nystagmus.  MEDICAL DECISION MAKING: Data Review:  Results for orders placed or performed during the hospital encounter of 10/13/21 (from the past 48 hour(s))  Group A Strep by PCR     Status: None   Collection Time: 10/13/21  4:47 PM   Specimen: Throat; Sterile Swab  Result Value Ref Range   Group A Strep by PCR NOT DETECTED NOT DETECTED    Comment: Performed at [facility], [address] [place], [place] [address]  SARS Coronavirus 2 by RT PCR (hospital order, performed in [facility] hospital lab) *cepheid single result test* Throat     Status: None   Collection Time: 10/13/21  4:47 PM   Specimen: Throat; Nasal Swab  Result Value Ref Range   SARS Coronavirus 2 by RT PCR NEGATIVE NEGATIVE    Comment:  (NOTE) SARS-CoV-2 target nucleic acids are NOT DETECTED.  The SARS-CoV-2 RNA is generally detectable in upper and lower respiratory specimens during the acute phase of infection. The lowest concentration of SARS-CoV-2 viral copies this assay can detect is 250 copies / mL. A negative result does not preclude SARS-CoV-2 infection and should not be used as the sole basis for treatment or other patient management decisions.  A negative result may occur with improper specimen collection / handling, submission of specimen other than nasopharyngeal swab, presence of viral mutation(s) within the areas targeted by this assay, and inadequate number of viral copies (<250 copies / mL). A negative result must be combined with clinical observations, patient history, and epidemiological information.  Fact Sheet for Patients:   RoadLapTop.co.za  Fact Sheet for Healthcare Providers: http://kim-miller.com/  This test is not yet approved or  cleared by the [place] FDA and has been authorized for detection and/or diagnosis of SARS-CoV-2 by FDA under an Emergency Use Authorization (EUA).  This EUA will remain in effect (meaning this test can  be used) for the duration of the COVID-19 declaration under Section 564(b)(1) of the Act, 21 U.S.C. section 360bbb-3(b)(1), unless the authorization is terminated or revoked sooner.  Performed at [facility], [address] [place], [place] [address]   POC Urine Pregnancy, ED     Status: None   Collection Time: 10/13/21  6:55 PM  Result Value Ref Range   Preg Test, Ur Negative Negative  Basic metabolic panel     Status: Abnormal   Collection Time: 10/13/21  7:07 PM  Result Value Ref Range   Sodium 137 135 - 145 mmol/L   Potassium 3.3 (L) 3.5 - 5.1 mmol/L   Chloride 107 98 - 111 mmol/L   CO2 19 (L) 22 - 32 mmol/L   Glucose, Bld 101 (H) 70 - 99 mg/dL    Comment: Glucose reference range applies  only to samples taken after fasting for at least 8 hours.   BUN 12 6 - 20 mg/dL   Creatinine, Ser 5.46 0.44 - 1.00 mg/dL   Calcium 8.8 (L) 8.9 - 10.3 mg/dL   GFR, Estimated >27 >03 mL/min    Comment: (NOTE) Calculated using the CKD-EPI Creatinine Equation (2021)    Anion gap 11 5 - 15    Comment: Performed at [facility], [address] [place], [place] [address]  CBC with Differential     Status: Abnormal   Collection Time: 10/13/21  7:07 PM  Result Value Ref Range   WBC 21.6 (H) 4.0 - 10.5 K/uL   RBC 4.45 3.87 - 5.11 MIL/uL   Hemoglobin 12.6 12.0 - 15.0 g/dL   HCT 81.8 29.9 - 37.1 %   MCV 84.3 80.0 - 100.0 fL   MCH 28.3 26.0 - 34.0 pg   MCHC 33.6 30.0 - 36.0 g/dL   RDW 69.6 78.9 - 38.1 %   Platelets 353 150 - 400 K/uL   nRBC 0.0 0.0 - 0.2 %   Neutrophils Relative % 84 %   Neutro Abs 18.1 (H) 1.7 - 7.7 K/uL   Lymphocytes Relative 9 %   Lymphs Abs 1.9 0.7 - 4.0 K/uL   Monocytes Relative 5 %   Monocytes Absolute 1.1 (H) 0.1 - 1.0 K/uL   Eosinophils Relative 1 %   Eosinophils Absolute 0.2 0.0 - 0.5 K/uL   Basophils Relative 0 %   Basophils Absolute 0.1 0.0 - 0.1 K/uL   Immature Granulocytes 1 %   Abs Immature Granulocytes 0.18 (H) 0.00 - 0.07 K/uL    Comment: Performed at [facility], [address], [place], [place] [address]  . CT Soft Tissue Neck W Contrast  Result Date: 10/13/2021 CLINICAL DATA:  Epiglottitis or tonsillitis suspected. Sore throat and rhinorrhea. EXAM: CT NECK WITH CONTRAST TECHNIQUE: Multidetector CT imaging of the neck was performed using the standard protocol following the bolus administration of intravenous contrast. RADIATION DOSE REDUCTION: This exam was performed according to the departmental dose-optimization program which includes automated exposure control, adjustment of the mA and/or kV according to patient size and/or use of iterative reconstruction technique. CONTRAST:  35mL OMNIPAQUE IOHEXOL 300 MG/ML  SOLN COMPARISON:  Chest  CTA 05/19/2020. FINDINGS: Pharynx and larynx: Mild epiglottic edema with more focal, rounded 9 mm focus along the superior aspect of the epiglottis in the midline potentially reflecting a superimposed cyst or more focal edema. Symmetrically enlarged soft tissue at the base of the tongue likely reflecting hyperplasia of the lingual tonsils. Widely patent airway. No retropharyngeal fluid collection. Salivary glands:  No inflammation, mass, or stone. Thyroid: Unremarkable. Lymph nodes: Mildly prominent level II lymph nodes bilaterally measuring up to 1 cm in short axis, likely benign/reactive. Vascular: Major vascular structures of the neck are grossly patent. Limited intracranial: Unremarkable. Visualized orbits: Unremarkable. Mastoids and visualized paranasal sinuses: Mild mucosal thickening in the paranasal sinuses. Included mastoid air cells are clear. Skeleton: No acute osseous abnormality or suspicious osseous lesion. Upper chest: Partially visualized nodule in the superior segment of the right lower lobe measuring at least 9 mm in size (series 4, image 109) and corresponding to a 13 mm nodule on the prior chest CTA. Mild paraseptal emphysema in the lung apices. Residual thymic tissue in the anterior mediastinum. Other: 2.3 cm cutaneous/subcutaneous cyst in the posterior left upper neck, likely an epidermal inclusion cyst. IMPRESSION: 1. Thickening of the epiglottis and lingual tonsils suggestive of supraglottitis. 2. Partially visualized persistent right lower lobe pulmonary nodule. Recommend follow-up chest CT in 12 months. 3. Emphysema (ICD10-J43.9). Electronically Signed   By: Sebastian Ache M.D.   On: 10/13/2021 20:12  .   PROCEDURE: Procedure: Diagnostic Fiberoptic Nasolaryngoscopy Diagnosis: Sore throat with spraglottitis Indications: evaluate airway Findings: Nasal cavity is mildly congested with clear secretions.  The nasopharynx is clear.  In the hypopharynx lingual tonsils are moderately  hypertrophic and inflamed without frank exudate.  There is some mild edema of the epiglottis with a small cyst on the tip of the epiglottis that appears inflamed.  Vocal cords are clear and mobile.  There is no evidence of any edema of the false cords or the aryepiglottic folds. Description of Procedure: After discussing procedure and risks  (primarily nose bleed) with the patient, the nose was anesthetized with topical Lidocaine 4% and decongested with phenylephrine. A flexible fiberoptic scope was passed through the right nasal cavity. The nasal cavity was inspected and the scope passed through the Nasopharynx to the region of the hypopharynx and larynx. The patient was instructed to phonate to assess vocal cord mobility. The tongue was extended to evaluate the tongue base completely. Valsalva was performed to insufflate the hypopharynx for improved examination. Findings are as noted above. The scope was withdrawn. The patient tolerated the procedure well.  ASSESSMENT: Lingual tonsillitis and supraglottitis with small inflamed epiglottic cyst.  She also has a sebaceous cyst involving the posterior neck on the left that is unrelated.  PLAN: Recommend IV antibiotics and steroids.  Unasyn should provide good coverage.  She appears to have a reasonable airway and can be observed on the floor as long as she is near nurses station with pulse oximetry.  Could consider future removal of the posterior neck sebaceous cyst as an outpatient.   [name], MD 10/13/2021 9:30 PM

## 2021-10-13 NOTE — ED Provider Notes (Addendum)
  Physical Exam  BP 103/63 (BP Location: Right Arm)   Pulse (!) 101   Temp 98.6 F (37 C) (Oral)   Resp 18   Ht 5\' 4"  (1.626 m)   Wt 72.6 kg   LMP 09/22/2021 (Approximate)   SpO2 96%   BMI 27.46 kg/m   Physical Exam  Procedures  .Critical Care  Performed by: 09/24/2021, PA-C Authorized by: Faythe Ghee, PA-C   Critical care provider statement:    Critical care time (minutes):  45   Critical care time was exclusive of:  Separately billable procedures and treating other patients   Critical care was necessary to treat or prevent imminent or life-threatening deterioration of the following conditions:  Respiratory failure   Critical care was time spent personally by me on the following activities:  Blood draw for specimens, development of treatment plan with patient or surrogate, discussions with consultants, evaluation of patient's response to treatment, examination of patient, obtaining history from patient or surrogate, ordering and review of laboratory studies, ordering and performing treatments and interventions, ordering and review of radiographic studies and review of old charts   I assumed direction of critical care for this patient from another provider in my specialty: yes     Care discussed with: admitting provider     ED Course / MDM    Medical Decision Making Amount and/or Complexity of Data Reviewed Labs: ordered. Radiology: ordered.  Risk Prescription drug management. Decision regarding hospitalization.   Transfer care to me at shift change by Faythe Ghee, FNP.  Plan at this time is to await CT.  Patient has been given Toradol for pain and Solu-Medrol for any potential swelling.  CT soft tissue of the neck was individually interpreted and reviewed by me as being positive for epiglottitis and supraglottitis  Consult to ENT, consult hospitalist  Spoke with Dr. Tye Maryland from ENT, states we can repeat the steroids in 4 to 6 hours.  Recommends Decadron  10 mg IV.  Also did start Unasyn IV.  Recommends only talk to the hospitalist that they may want to consider putting her in the unit for close observation due to the possible airway compromise.  Patient does not have any airway compromise at this time.  Awaiting call from hospitalist.  Dr. Willeen Cass in to see the patient. Spoke with Dr. Willeen Cass hospitalist.  She will place patient on stepdown unit due to possible airway compromise.  She has no airway compromise at this time but Dr. Kevan Ny feels that she should be monitored with full pulse ox available.   Willeen Cass, PA-C 10/13/21 2155    2156, PA-C 10/14/21 1622    12/14/21, MD 10/23/21 1336

## 2021-10-13 NOTE — ED Triage Notes (Signed)
Pt to ED for sore throat since this morning. States painful to swallow and throat feels swollen. Pt also has runny nose. Negative home covid test today. Denies cough, SOB.

## 2021-10-13 NOTE — H&P (Signed)
History and Physical    Patient: Gabrielle Harmon UXN:[id number] DOB: [birth year] DOA: 10/13/2021 DOS: the patient was seen and examined on 10/13/2021 PCP: [facility]  Patient coming from: Home - lives with 3 teenaged daughters; NOK: Mother, Loney Laurence, [phone]   Chief Complaint: sore throat  HPI: Gabrielle Harmon is a 36 y.o. female with medical history significant of depression presenting with sore throat.  She reports that she was fine yesterday and awoke this AM with a very sore throat.  She went to work anyway but by the time she came home at the end of the day it was on fire.  She became upset and could not breathe out of her nose due to congestion while she was crying and should felt like she could not breathe at all.  She feels better now from that standpoint but continues to have severe sore throat and difficulty tolerating her secretions.  No fever.  No sick contacts.  She did wear earplugs at work today and her L ear felt like is was clogged up.    ER Course:  Supraglottitis, ok to admit to floor with continuous pulse ox.  ENT is at the bedside.  Antibiotics and steroids are likely to take care of the problem.     Review of Systems: As mentioned in the history of present illness. All other systems reviewed and are negative. Past Medical History:  Diagnosis Date   Depression    Past Surgical History:  Procedure Laterality Date   adenocetomy     ESOPHAGOGASTRODUODENOSCOPY ENDOSCOPY     TUBAL LIGATION     Social History:  reports that she has been smoking cigarettes. She has a 20.00 pack-year smoking history. She has never used smokeless tobacco. She reports that she does not drink alcohol and does not use drugs.  No Known Allergies  Family History  Problem Relation Age of Onset   Diabetes Paternal Aunt     Prior to Admission medications   Medication Sig Start Date End Date Taking? Authorizing Provider  citalopram (CELEXA) 40 MG tablet Take 40 mg by mouth  every morning. 12/25/16   [provider]  cyclobenzaprine (FLEXERIL) 5 MG tablet Take 1 tablet (5 mg total) by mouth 3 (three) times daily as needed for muscle spasms. 05/19/20   Willy Eddy, MD  IBU 600 MG tablet Take 1 tablet by mouth as needed. 12/25/16   [provider]  Phendimetrazine Tartrate 35 MG TABS Take 1 tablet by mouth daily. 12/27/16   [provider]  traMADol (ULTRAM) 50 MG tablet Take 1 tablet by mouth as needed. 12/18/16   [provider]    Physical Exam: Vitals:   10/13/21 2100 10/13/21 2130 10/13/21 2200 10/13/21 2300  BP: 101/75 97/72 101/64 101/64  Pulse: 92 87 79 83  Resp:    17  Temp:    98.6 F (37 C)  TempSrc:    Oral  SpO2: 97% 98% 98% 99%  Weight:      Height:       General:  Appears calm and comfortable and is in NAD Eyes:  PERRL, EOMI, normal lids, iris ENT:  grossly normal hearing, lips & tongue, mmm Neck:  no LAD, masses or thyromegaly Cardiovascular:  RRR, no m/r/g. No LE edema.  Respiratory:   CTA bilaterally with no wheezes/rales/rhonchi.  Normal respiratory effort. Abdomen:  soft, NT, ND Skin:  no rash or induration seen on limited exam Musculoskeletal:  grossly normal tone BUE/BLE, good  ROM, no bony abnormality Psychiatric:  grossly normal mood and affect, speech fluent and appropriate, AOx3 Neurologic:  CN 2-12 grossly intact, moves all extremities in coordinated fashion   Radiological Exams on Admission: Independently reviewed - see discussion in A/P where applicable  CT Soft Tissue Neck W Contrast  Result Date: 10/13/2021 CLINICAL DATA:  Epiglottitis or tonsillitis suspected. Sore throat and rhinorrhea. EXAM: CT NECK WITH CONTRAST TECHNIQUE: Multidetector CT imaging of the neck was performed using the standard protocol following the bolus administration of intravenous contrast. RADIATION DOSE REDUCTION: This exam was performed according to the departmental dose-optimization program which includes  automated exposure control, adjustment of the mA and/or kV according to patient size and/or use of iterative reconstruction technique. CONTRAST:  24mL OMNIPAQUE IOHEXOL 300 MG/ML  SOLN COMPARISON:  Chest CTA 05/19/2020. FINDINGS: Pharynx and larynx: Mild epiglottic edema with more focal, rounded 9 mm focus along the superior aspect of the epiglottis in the midline potentially reflecting a superimposed cyst or more focal edema. Symmetrically enlarged soft tissue at the base of the tongue likely reflecting hyperplasia of the lingual tonsils. Widely patent airway. No retropharyngeal fluid collection. Salivary glands: No inflammation, mass, or stone. Thyroid: Unremarkable. Lymph nodes: Mildly prominent level II lymph nodes bilaterally measuring up to 1 cm in short axis, likely benign/reactive. Vascular: Major vascular structures of the neck are grossly patent. Limited intracranial: Unremarkable. Visualized orbits: Unremarkable. Mastoids and visualized paranasal sinuses: Mild mucosal thickening in the paranasal sinuses. Included mastoid air cells are clear. Skeleton: No acute osseous abnormality or suspicious osseous lesion. Upper chest: Partially visualized nodule in the superior segment of the right lower lobe measuring at least 9 mm in size (series 4, image 109) and corresponding to a 13 mm nodule on the prior chest CTA. Mild paraseptal emphysema in the lung apices. Residual thymic tissue in the anterior mediastinum. Other: 2.3 cm cutaneous/subcutaneous cyst in the posterior left upper neck, likely an epidermal inclusion cyst. IMPRESSION: 1. Thickening of the epiglottis and lingual tonsils suggestive of supraglottitis. 2. Partially visualized persistent right lower lobe pulmonary nodule. Recommend follow-up chest CT in 12 months. 3. Emphysema (ICD10-J43.9). Electronically Signed   By: Sebastian Ache M.D.   On: 10/13/2021 20:12    EKG: not done   Labs on Admission: I have personally reviewed the available labs and  imaging studies at the time of the admission.  Pertinent labs:    K+ 3.3 WBC 21.6 Urine pregnancy negative GAS negative   Assessment and Plan: Principal Problem:   Supraglottitis without airway obstruction Active Problems:   COPD not affecting current episode of care (HCC)   Lung nodule, solitary   Tobacco dependence    Supraglottitis -Patient presenting with acute onset of sore throat and subsequent difficulty in tolerating secretions -CT was performed and showed supraglottitis -She was seen by Dr. Willeen Cass from ENT and had laryngoscopy that showed an intact airway without compromise -Will observe overnight in SDU with continuous pulse ox -Continue NPO due to difficulty tolerating secretions; can advance diet once this improves -Continue Unasyn -Continue Solumedrol  COPD with ongoing tobacco dependence -COPD appreciated on CT -Also has a RLL pulmonary nodule that needs repeat CT in 12 months -Tobacco Dependence: encourage cessation; this was discussed with the patient and should be reviewed on an ongoing basis.   -Patch ordered   Overweight -On Wegovy, will hold inpatient    Advance Care Planning:   Code Status: Full Code   Consults: ENT  DVT Prophylaxis: Lovenox  Family Communication:  None present; her mother was here earlier in the evening and is aware of her condition  Severity of Illness: The appropriate patient status for this patient is OBSERVATION. Observation status is judged to be reasonable and necessary in order to provide the required intensity of service to ensure the patient's safety. The patient's presenting symptoms, physical exam findings, and initial radiographic and laboratory data in the context of their medical condition is felt to place them at decreased risk for further clinical deterioration. Furthermore, it is anticipated that the patient will be medically stable for discharge from the hospital within 2 midnights of admission.    Author: Jonah Blue, MD 10/13/2021 11:04 PM  For on call review [email or web address].

## 2021-10-13 NOTE — ED Provider Notes (Signed)
[facility] Provider Note    Event Date/Time   First MD Initiated Contact with Patient 10/13/21 1706     (approximate)   History   Sore Throat   HPI  Gabrielle Harmon is a 36 y.o. female presents to the emergency department for treatment and evaluation of sore throat.  Upon awakening this morning she had a very sore throat and had difficulty swallowing due to the pain.  No fever.  No nausea, vomiting, diarrhea, cough, or other symptoms of concern.  She also has a small, tender, raised area on the left side of her neck just under her hairline.  No previous skin infections.  Lesion has been present since June.  Past Medical History:  Diagnosis Date   Depression      Physical Exam   Triage Vital Signs: ED Triage Vitals  Enc Vitals Group     BP 10/13/21 1644 114/73     Pulse Rate 10/13/21 1644 97     Resp 10/13/21 1644 16     Temp 10/13/21 1644 98.6 F (37 C)     Temp Source 10/13/21 1644 Oral     SpO2 10/13/21 1644 99 %     Weight 10/13/21 1641 160 lb (72.6 kg)     Height 10/13/21 1641 5\' 4"  (1.626 m)     Head Circumference --      Peak Flow --      Pain Score 10/13/21 1641 10     Pain Loc --      Pain Edu? --      Excl. in GC? --     Most recent vital signs: Vitals:   10/13/21 1644 10/13/21 1904  BP: 114/73 103/63  Pulse: 97 (!) 101  Resp: 16 18  Temp: 98.6 F (37 C)   SpO2: 99% 96%    General: Awake, no distress.  CV:  Good peripheral perfusion.  Resp:  Normal effort.  Abd:  No distention.  Other:  Posterior oropharynx mildly erythematous with cobblestone exudate.  Uvula appears erythematous and mildly edematous.  Airway is otherwise patent.  No local cervical adenopathy palpable.  Small fluctuant, cystic, mildly erythematous area on the posterior left neck just below her hairline.   ED Results / Procedures / Treatments   Labs (all labs ordered are listed, but only abnormal results are displayed) Labs Reviewed  CBC WITH  DIFFERENTIAL/PLATELET - Abnormal; Notable for the following components:      Result Value   WBC 21.6 (*)    Neutro Abs 18.1 (*)    Monocytes Absolute 1.1 (*)    Abs Immature Granulocytes 0.18 (*)    All other components within normal limits  GROUP A STREP BY PCR  SARS CORONAVIRUS 2 BY RT PCR  BASIC METABOLIC PANEL  POC URINE PREG, ED     EKG     RADIOLOGY  Pending  I have independently reviewed and interpreted imaging as well as reviewed report from radiology.  PROCEDURES:  Critical Care performed: No  Procedures   MEDICATIONS ORDERED IN ED:  Medications  methylPREDNISolone sodium succinate (SOLU-MEDROL) 125 mg/2 mL injection 125 mg (125 mg Intramuscular Given 10/13/21 1744)  ketorolac (TORADOL) 30 MG/ML injection 15 mg (15 mg Intravenous Given 10/13/21 1902)     IMPRESSION / MDM / ASSESSMENT AND PLAN / ED COURSE   I reviewed the triage vital signs and the nursing notes.  Differential diagnosis includes, but is not limited to: Uvulitis, acute pharyngitis, viral pharyngitis, abscess  Patient's presentation is most consistent with acute presentation with potential threat to life or bodily function.  36 year old female presents to the emergency department for treatment and evaluation of onset of pharyngitis this morning.  See HPI for further details.  IM Solumedrol given with some improvement. Continues to have pain with swallowing and feels like her throat is swelling. On exam, airway is patent and oxygen saturation is 96% on room air. Will get CT soft tissue neck and labs. COVID and strep are both negative.  Care transferred to Greig Right, PA-C who will follow up on CT and order any additional treatments if needed.     FINAL CLINICAL IMPRESSION(S) / ED DIAGNOSES   Final diagnoses:  Acute pharyngitis, unspecified etiology     Rx / DC Orders   ED Discharge Orders     None        Note:  This document was prepared using Dragon voice recognition  software and may include unintentional dictation errors.   Chinita Pester, FNP 10/13/21 1924    Sharman Cheek, MD 10/23/21 1336

## 2021-10-13 NOTE — Progress Notes (Signed)
Pharmacy Antibiotic Note  Gabrielle Harmon is a 36 y.o. female admitted on 10/13/2021 with  supraglottitis .  Pharmacy has been consulted for Unasyn dosing.  Plan: Unasyn 3 gm IV X 1 given in ED on 8/4 @ 2100. Unasyn 3 gm IV Q6H ordered to continue on 8/5 @ 0300.   Height: 5\' 4"  (162.6 cm) Weight: 72.6 kg (160 lb) IBW/kg (Calculated) : 54.7  Temp (24hrs), Avg:98.6 F (37 C), Min:98.6 F (37 C), Max:98.6 F (37 C)  Recent Labs  Lab 10/13/21 1907  WBC 21.6*  CREATININE 0.73    Estimated Creatinine Clearance: 95 mL/min (by C-G formula based on SCr of 0.73 mg/dL).    No Known Allergies  Antimicrobials this admission:   >>   >>   Dose adjustments this admission:   Microbiology results:  BCx:   UCx:    Sputum:    MRSA PCR:   Thank you for allowing pharmacy to be a part of this patient's care.  Milea Klink D 10/13/2021 10:46 PM

## 2021-10-14 DIAGNOSIS — J0430 Supraglottitis, unspecified, without obstruction: Secondary | ICD-10-CM | POA: Diagnosis not present

## 2021-10-14 LAB — BASIC METABOLIC PANEL
Anion gap: 4 — ABNORMAL LOW (ref 5–15)
BUN: 14 mg/dL (ref 6–20)
CO2: 21 mmol/L — ABNORMAL LOW (ref 22–32)
Calcium: 8 mg/dL — ABNORMAL LOW (ref 8.9–10.3)
Chloride: 114 mmol/L — ABNORMAL HIGH (ref 98–111)
Creatinine, Ser: 0.53 mg/dL (ref 0.44–1.00)
GFR, Estimated: >60 mL/min (ref >60)
Glucose, Bld: 139 mg/dL — ABNORMAL HIGH (ref 70–99)
Potassium: 4.5 mmol/L (ref 3.5–5.1)
Sodium: 139 mmol/L (ref 135–145)

## 2021-10-14 LAB — CBC
HCT: 32.6 % — ABNORMAL LOW (ref 36.0–46.0)
Hemoglobin: 11.2 g/dL — ABNORMAL LOW (ref 12.0–15.0)
MCH: 28.9 pg (ref 26.0–34.0)
MCHC: 34.4 g/dL (ref 30.0–36.0)
MCV: 84.2 fL (ref 80.0–100.0)
Platelets: 274 10*3/uL (ref 150–400)
RBC: 3.87 MIL/uL (ref 3.87–5.11)
RDW: 15.3 % (ref 11.5–15.5)
WBC: 13.5 10*3/uL — ABNORMAL HIGH (ref 4.0–10.5)
nRBC: 0 % (ref 0.0–0.2)

## 2021-10-14 LAB — HIV ANTIBODY (ROUTINE TESTING W REFLEX): HIV Screen 4th Generation wRfx: NONREACTIVE

## 2021-10-14 MED ORDER — AMOXICILLIN-POT CLAVULANATE 875-125 MG PO TABS: 1.0000 | ORAL_TABLET | Freq: Two times a day (BID) | ORAL | 0 refills | Status: AC

## 2021-10-14 MED ORDER — LACTATED RINGERS IV BOLUS
1000.0000 mL | Freq: Once | INTRAVENOUS | Status: AC
  Administered 2021-10-14: 1000 mL via INTRAVENOUS

## 2021-10-14 MED ORDER — NICOTINE 14 MG/24HR TD PT24: 14.0000 mg | MEDICATED_PATCH | Freq: Every day | TRANSDERMAL | 0 refills | Status: DC

## 2021-10-14 MED ORDER — PREDNISONE 10 MG (21) PO TBPK: ORAL_TABLET | ORAL | 0 refills | Status: DC

## 2021-10-14 MED ORDER — AMPICILLIN-SULBACTAM SODIUM 3 (2-1) G IJ SOLR
3.0000 g | Freq: Once | INTRAMUSCULAR | Status: AC
  Administered 2021-10-14: 3 g via INTRAVENOUS
  Filled 2021-10-14: qty 8

## 2021-10-14 NOTE — ED Notes (Signed)
Rn informed bed assigned 

## 2021-10-14 NOTE — Discharge Summary (Signed)
Physician Discharge Summary   Gabrielle Harmon  female DOB: 11/29/1985  YDX:[id number]  PCP: [facility]  Admit date: 10/13/2021 Discharge date: 10/14/2021  Admitted From: home Disposition:  home CODE STATUS: Full code  Discharge Instructions     Discharge instructions   Complete by: As directed    You are prescribed antibiotic Augmentin and steroid prednisone for your Supraglottitis.  Please take them as directed.  Please follow up with ENT Dr. Willeen Cass.   Dr. Darlin Priestly [facility] Course:  For full details, please see H&P, progress notes, consult notes and ancillary notes.  Briefly,  Gabrielle Harmon is a 36 y.o. female with medical history significant of depression presenting with sore throat and difficulty tolerating her secretions.  Lingual tonsillitis and supraglottitis  -CT was performed and showed supraglottitis -She was seen by Dr. Willeen Cass from ENT and had laryngoscopy that showed an intact airway without compromise --Pt received Unasyn x 4 doses, solumedrol 125 mg and then 100 mg, with improvement in her symptoms the next day, and had no trouble with swallowing and airway.   --pt was discharged on Augmentin to finish a 10-day course, and prednisone STERAPRED taper. --pt will follow up with Dr. Willeen Cass as outpatient.   COPD  with ongoing tobacco dependence -COPD appreciated on CT -Tobacco Dependence: encourage cessation; this was discussed with the patient and should be reviewed on an ongoing basis.   -Patch ordered    RLL pulmonary nodule  --outpatient repeat CT in 12 months  Overweight -On Wegovy, resume after discharge.   Discharge Diagnoses:  Principal Problem:   Supraglottitis without airway obstruction Active Problems:   COPD not affecting current episode of care [facility])   Lung nodule, solitary   Tobacco dependence     Discharge Instructions:  Allergies as of 10/14/2021   No Known Allergies      Medication List     TAKE  these medications    amoxicillin-clavulanate 875-125 MG tablet Commonly known as: AUGMENTIN Take 1 tablet by mouth 2 (two) times daily for 9 days.   IBU PO Take 400 mg by mouth daily as needed for headache, fever or mild pain.   nicotine 14 mg/24hr patch Commonly known as: NICODERM CQ - dosed in mg/24 hours Place 1 patch (14 mg total) onto the skin daily. Start taking on: October 15, 2021   predniSONE 10 MG (21) Tbpk tablet Commonly known as: STERAPRED UNI-PAK 21 TAB Adult Oral dosage (Sterapred 5 mg tablets or Sterapred-DS 10 mg tablets, 21-tablet dose pack): Day 1: 2 tablets PO before breakfast, 1 tablet PO after lunch, 1 tablet PO after supper, and 2 tablets PO at bedtime. Day 2: 1 tablet PO before breakfast. 1 tablet PO after lunch, 1 tablet PO after supper, and 2 tablets PO at bedtime. Day 3: 1 tablet PO before breakfast, 1 tablet PO after lunch, 1 tablet PO after supper, and 1 tablet PO at bedtime. Day 4: 1 tablet PO before breakfast, 1 tablet PO after lunch, and 1 tablet PO at bedtime. Day 5: 1 tablet PO before breakfast, and 1 tablet PO at bedtime. Day 6: 1 tablet PO before breakfast.   traMADol 50 MG tablet Commonly known as: ULTRAM Take 1 tablet by mouth as needed.   Wegovy 2.4 MG/0.75ML Soaj Generic drug: Semaglutide-Weight Management Inject 2.4 mg into the skin every 7 (seven) days.          No Known Allergies   The  results of significant diagnostics from this hospitalization (including imaging, microbiology, ancillary and laboratory) are listed below for reference.   Consultations:   Procedures/Studies: CT Soft Tissue Neck W Contrast  Result Date: 10/13/2021 CLINICAL DATA:  Epiglottitis or tonsillitis suspected. Sore throat and rhinorrhea. EXAM: CT NECK WITH CONTRAST TECHNIQUE: Multidetector CT imaging of the neck was performed using the standard protocol following the bolus administration of intravenous contrast. RADIATION DOSE REDUCTION: This exam was performed  according to the departmental dose-optimization program which includes automated exposure control, adjustment of the mA and/or kV according to patient size and/or use of iterative reconstruction technique. CONTRAST:  39mL OMNIPAQUE IOHEXOL 300 MG/ML  SOLN COMPARISON:  Chest CTA 05/19/2020. FINDINGS: Pharynx and larynx: Mild epiglottic edema with more focal, rounded 9 mm focus along the superior aspect of the epiglottis in the midline potentially reflecting a superimposed cyst or more focal edema. Symmetrically enlarged soft tissue at the base of the tongue likely reflecting hyperplasia of the lingual tonsils. Widely patent airway. No retropharyngeal fluid collection. Salivary glands: No inflammation, mass, or stone. Thyroid: Unremarkable. Lymph nodes: Mildly prominent level II lymph nodes bilaterally measuring up to 1 cm in short axis, likely benign/reactive. Vascular: Major vascular structures of the neck are grossly patent. Limited intracranial: Unremarkable. Visualized orbits: Unremarkable. Mastoids and visualized paranasal sinuses: Mild mucosal thickening in the paranasal sinuses. Included mastoid air cells are clear. Skeleton: No acute osseous abnormality or suspicious osseous lesion. Upper chest: Partially visualized nodule in the superior segment of the right lower lobe measuring at least 9 mm in size (series 4, image 109) and corresponding to a 13 mm nodule on the prior chest CTA. Mild paraseptal emphysema in the lung apices. Residual thymic tissue in the anterior mediastinum. Other: 2.3 cm cutaneous/subcutaneous cyst in the posterior left upper neck, likely an epidermal inclusion cyst. IMPRESSION: 1. Thickening of the epiglottis and lingual tonsils suggestive of supraglottitis. 2. Partially visualized persistent right lower lobe pulmonary nodule. Recommend follow-up chest CT in 12 months. 3. Emphysema (ICD10-J43.9). Electronically Signed   By: Sebastian Ache M.D.   On: 10/13/2021 20:12      Labs: BNP  (last 3 results) No results for input(s): "BNP" in the last 8760 hours. Basic Metabolic Panel: Recent Labs  Lab 10/13/21 1907 10/14/21 0447  NA 137 139  K 3.3* 4.5  CL 107 114*  CO2 19* 21*  GLUCOSE 101* 139*  BUN 12 14  CREATININE 0.73 0.53  CALCIUM 8.8* 8.0*   Liver Function Tests: No results for input(s): "AST", "ALT", "ALKPHOS", "BILITOT", "PROT", "ALBUMIN" in the last 168 hours. No results for input(s): "LIPASE", "AMYLASE" in the last 168 hours. No results for input(s): "AMMONIA" in the last 168 hours. CBC: Recent Labs  Lab 10/13/21 1907 10/14/21 0447  WBC 21.6* 13.5*  NEUTROABS 18.1*  --   HGB 12.6 11.2*  HCT 37.5 32.6*  MCV 84.3 84.2  PLT 353 274   Cardiac Enzymes: No results for input(s): "CKTOTAL", "CKMB", "CKMBINDEX", "TROPONINI" in the last 168 hours. BNP: Invalid input(s): "POCBNP" CBG: No results for input(s): "GLUCAP" in the last 168 hours. D-Dimer No results for input(s): "DDIMER" in the last 72 hours. Hgb A1c No results for input(s): "HGBA1C" in the last 72 hours. Lipid Profile No results for input(s): "CHOL", "HDL", "LDLCALC", "TRIG", "CHOLHDL", "LDLDIRECT" in the last 72 hours. Thyroid function studies No results for input(s): "TSH", "T4TOTAL", "T3FREE", "THYROIDAB" in the last 72 hours.  Invalid input(s): "FREET3" Anemia work up No results for input(s): "VITAMINB12", "FOLATE", "  FERRITIN", "TIBC", "IRON", "RETICCTPCT" in the last 72 hours. Urinalysis    Component Value Date/Time   COLORURINE YELLOW (A) 08/09/2017 1812   APPEARANCEUR HAZY (A) 08/09/2017 1812   LABSPEC 1.009 08/09/2017 1812   PHURINE 8.0 08/09/2017 1812   GLUCOSEU NEGATIVE 08/09/2017 1812   HGBUR NEGATIVE 08/09/2017 1812   BILIRUBINUR NEGATIVE 08/09/2017 1812   KETONESUR NEGATIVE 08/09/2017 1812   PROTEINUR NEGATIVE 08/09/2017 1812   NITRITE NEGATIVE 08/09/2017 1812   LEUKOCYTESUR NEGATIVE 08/09/2017 1812   Sepsis Labs Recent Labs  Lab 10/13/21 1907 10/14/21 0447   WBC 21.6* 13.5*   Microbiology Recent Results (from the past 240 hour(s))  Group A Strep by PCR     Status: None   Collection Time: 10/13/21  4:47 PM   Specimen: Throat; Sterile Swab  Result Value Ref Range Status   Group A Strep by PCR NOT DETECTED NOT DETECTED Final    Comment: Performed at [facility], [address] [place], [place] [address]  SARS Coronavirus 2 by RT PCR (hospital order, performed in [facility] hospital lab) *cepheid single result test* Throat     Status: None   Collection Time: 10/13/21  4:47 PM   Specimen: Throat; Nasal Swab  Result Value Ref Range Status   SARS Coronavirus 2 by RT PCR NEGATIVE NEGATIVE Final    Comment: (NOTE) SARS-CoV-2 target nucleic acids are NOT DETECTED.  The SARS-CoV-2 RNA is generally detectable in upper and lower respiratory specimens during the acute phase of infection. The lowest concentration of SARS-CoV-2 viral copies this assay can detect is 250 copies / mL. A negative result does not preclude SARS-CoV-2 infection and should not be used as the sole basis for treatment or other patient management decisions.  A negative result may occur with improper specimen collection / handling, submission of specimen other than nasopharyngeal swab, presence of viral mutation(s) within the areas targeted by this assay, and inadequate number of viral copies (<250 copies / mL). A negative result must be combined with clinical observations, patient history, and epidemiological information.  Fact Sheet for Patients:   RoadLapTop.co.za  Fact Sheet for Healthcare Providers: http://kim-miller.com/  This test is not yet approved or  cleared by the [place] FDA and has been authorized for detection and/or diagnosis of SARS-CoV-2 by FDA under an Emergency Use Authorization (EUA).  This EUA will remain in effect (meaning this test can be used) for the duration of the COVID-19  declaration under Section 564(b)(1) of the Act, 21 U.S.C. section 360bbb-3(b)(1), unless the authorization is terminated or revoked sooner.  Performed at [facility], [address] [place], [place] [address]      Total time spend on discharging this patient, including the last patient exam, discussing the hospital stay, instructions for ongoing care as it relates to all pertinent caregivers, as well as preparing the medical discharge records, prescriptions, and/or referrals as applicable, is 45 minutes.    Darlin Priestly, MD  [facility] 10/14/2021, 12:50 PM

## 2021-10-14 NOTE — Progress Notes (Addendum)
Gabrielle Harmon, Gabrielle Harmon [id number] Oct 04, 1985 [name], MD   SUBJECTIVE: This 36 y.o. year old female is seen in follow-up of lingual tonsillitis with mild supraglottitis.  She reports considerable improvement since last night.  She has not tried a solid diet but has swallowed liquids without difficulty and the throat feels substantially less swollen with improvement in neck tenderness.   Medications:  Current Facility-Administered Medications  Medication Dose Route Frequency Provider Last Rate Last Admin   acetaminophen (TYLENOL) tablet 650 mg  650 mg Oral Q6H PRN Jonah Blue, MD       Or   acetaminophen (TYLENOL) suppository 650 mg  650 mg Rectal Q6H PRN Jonah Blue, MD       albuterol (PROVENTIL) (2.5 MG/3ML) 0.083% nebulizer solution 2.5 mg  2.5 mg Nebulization Q2H PRN Jonah Blue, MD       bisacodyl (DULCOLAX) EC tablet 5 mg  5 mg Oral Daily PRN Jonah Blue, MD       enoxaparin (LOVENOX) injection 40 mg  40 mg Subcutaneous Q24H Jonah Blue, MD   40 mg at 10/13/21 2356   hydrALAZINE (APRESOLINE) injection 5 mg  5 mg Intravenous Q4H PRN Jonah Blue, MD       ketorolac (TORADOL) 30 MG/ML injection 30 mg  30 mg Intravenous Q6H PRN Jonah Blue, MD       lactated ringers infusion   Intravenous Continuous Jonah Blue, MD 75 mL/hr at 10/14/21 0156 New Bag at 10/14/21 0156   methylPREDNISolone sodium succinate (SOLU-MEDROL) 125 mg/2 mL injection 100 mg  100 mg Intravenous Steva Colder, MD   100 mg at 10/14/21 0439   morphine (PF) 2 MG/ML injection 2 mg  2 mg Intravenous Q2H PRN Jonah Blue, MD   2 mg at 10/14/21 1029   nicotine (NICODERM CQ - dosed in mg/24 hours) patch 14 mg  14 mg Transdermal Daily Jonah Blue, MD   14 mg at 10/14/21 1026   ondansetron (ZOFRAN) tablet 4 mg  4 mg Oral Q6H PRN Jonah Blue, MD       Or   ondansetron [facility]) injection 4 mg  4 mg Intravenous Q6H PRN Jonah Blue, MD   4 mg at 10/14/21 0020   polyethylene glycol  (MIRALAX / GLYCOLAX) packet 17 g  17 g Oral Daily PRN Jonah Blue, MD       sodium chloride flush (NS) 0.9 % injection 3 mL  3 mL Intravenous Steva Colder, MD   3 mL at 10/14/21 6761   Current Outpatient Medications  Medication Sig Dispense Refill   amoxicillin-clavulanate (AUGMENTIN) 875-125 MG tablet Take 1 tablet by mouth 2 (two) times daily for 9 days. 18 tablet 0   Ibuprofen (IBU PO) Take 400 mg by mouth daily as needed for headache, fever or mild pain.  0   predniSONE (STERAPRED UNI-PAK 21 TAB) 10 MG (21) TBPK tablet Adult Oral dosage (Sterapred 5 mg tablets or Sterapred-DS 10 mg tablets, 21-tablet dose pack): Day 1: 2 tablets PO before breakfast, 1 tablet PO after lunch, 1 tablet PO after supper, and 2 tablets PO at bedtime. Day 2: 1 tablet PO before breakfast. 1 tablet PO after lunch, 1 tablet PO after supper, and 2 tablets PO at bedtime. Day 3: 1 tablet PO before breakfast, 1 tablet PO after lunch, 1 tablet PO after supper, and 1 tablet PO at bedtime. Day 4: 1 tablet PO before breakfast, 1 tablet PO after lunch, and 1 tablet PO at bedtime. Day 5: 1 tablet PO  before breakfast, and 1 tablet PO at bedtime. Day 6: 1 tablet PO before breakfast. 21 tablet 0   traMADol (ULTRAM) 50 MG tablet Take 1 tablet by mouth as needed.  0   WEGOVY 2.4 MG/0.75ML SOAJ Inject 2.4 mg into the skin every 7 (seven) days.     [START ON 10/15/2021] nicotine (NICODERM CQ - DOSED IN MG/24 HOURS) 14 mg/24hr patch Place 1 patch (14 mg total) onto the skin daily. 28 patch 0  . (Not in a hospital admission)   OBJECTIVE:  PHYSICAL EXAM  Vitals: Blood pressure (!) 98/56, pulse 74, temperature 97.9 F (36.6 C), temperature source Oral, resp. rate 18, height 5\' 4"  (1.626 m), weight 72.6 kg, last menstrual period 09/22/2021, SpO2 93 %, unknown if currently breastfeeding.. General: Well-developed, Well-nourished in no acute distress Mood: Mood and affect well adjusted, pleasant and cooperative. Orientation: Grossly  alert and oriented. Vocal Quality: No hoarseness. Communicates verbally. head and Face: NCAT. No facial asymmetry. No visible skin lesions. No significant facial scars. No tenderness with sinus percussion. Facial strength normal and symmetric. Oral Cavity/ Oropharynx: Lips are normal with no lesions. Teeth no frank dental caries. Gingiva healthy with no lesions or gingivitis. Oropharynx including tongue, buccal mucosa, floor of mouth, hard and soft palate, uvula and posterior pharynx free of exudates, erythema or lesions with normal symmetry and hydration.  Neck: Supple and symmetric with no palpable masses, tenderness or crepitance. The trachea is midline. Thyroid gland is soft, nontender and symmetric with no masses or enlargement. Parotid and submandibular glands are soft, nontender and symmetric, without masses. Lymphatic: Cervical lymph nodes are without palpable lymphadenopathy or tenderness. Respiratory: Normal respiratory effort without labored breathing.  MEDICAL DECISION MAKING: Data Review:  Results for orders placed or performed during the hospital encounter of 10/13/21 (from the past 48 hour(s))  Group A Strep by PCR     Status: None   Collection Time: 10/13/21  4:47 PM   Specimen: Throat; Sterile Swab  Result Value Ref Range   Group A Strep by PCR NOT DETECTED NOT DETECTED    Comment: Performed at [facility], [address] [place], [place] [address]  SARS Coronavirus 2 by RT PCR (hospital order, performed in [facility] hospital lab) *cepheid single result test* Throat     Status: None   Collection Time: 10/13/21  4:47 PM   Specimen: Throat; Nasal Swab  Result Value Ref Range   SARS Coronavirus 2 by RT PCR NEGATIVE NEGATIVE    Comment: (NOTE) SARS-CoV-2 target nucleic acids are NOT DETECTED.  The SARS-CoV-2 RNA is generally detectable in upper and lower respiratory specimens during the acute phase of infection. The lowest concentration of SARS-CoV-2 viral copies  this assay can detect is 250 copies / mL. A negative result does not preclude SARS-CoV-2 infection and should not be used as the sole basis for treatment or other patient management decisions.  A negative result may occur with improper specimen collection / handling, submission of specimen other than nasopharyngeal swab, presence of viral mutation(s) within the areas targeted by this assay, and inadequate number of viral copies (<250 copies / mL). A negative result must be combined with clinical observations, patient history, and epidemiological information.  Fact Sheet for Patients:   RoadLapTop.co.zahttps://www.fda.gov/media/158405/download  Fact Sheet for Healthcare Providers: http://kim-miller.com/https://www.fda.gov/media/158404/download  This test is not yet approved or  cleared by the [place] FDA and has been authorized for detection and/or diagnosis of SARS-CoV-2 by FDA under an Emergency Use Authorization (EUA).  This EUA will remain in effect (meaning this test can be used) for the duration of the COVID-19 declaration under Section 564(b)(1) of the Act, 21 U.S.C. section 360bbb-3(b)(1), unless the authorization is terminated or revoked sooner.  Performed at [facility], [address] [place], [place] [address]   POC Urine Pregnancy, ED     Status: None   Collection Time: 10/13/21  6:55 PM  Result Value Ref Range   Preg Test, Ur Negative Negative  Basic metabolic panel     Status: Abnormal   Collection Time: 10/13/21  7:07 PM  Result Value Ref Range   Sodium 137 135 - 145 mmol/L   Potassium 3.3 (L) 3.5 - 5.1 mmol/L   Chloride 107 98 - 111 mmol/L   CO2 19 (L) 22 - 32 mmol/L   Glucose, Bld 101 (H) 70 - 99 mg/dL    Comment: Glucose reference range applies only to samples taken after fasting for at least 8 hours.   BUN 12 6 - 20 mg/dL   Creatinine, Ser 8.46 0.44 - 1.00 mg/dL   Calcium 8.8 (L) 8.9 - 10.3 mg/dL   GFR, Estimated >65 >99 mL/min    Comment: (NOTE) Calculated using the  CKD-EPI Creatinine Equation (2021)    Anion gap 11 5 - 15    Comment: Performed at [facility], [address] [place], [place] [address]  CBC with Differential     Status: Abnormal   Collection Time: 10/13/21  7:07 PM  Result Value Ref Range   WBC 21.6 (H) 4.0 - 10.5 K/uL   RBC 4.45 3.87 - 5.11 MIL/uL   Hemoglobin 12.6 12.0 - 15.0 g/dL   HCT 77.9 39.0 - 30.0 %   MCV 84.3 80.0 - 100.0 fL   MCH 28.3 26.0 - 34.0 pg   MCHC 33.6 30.0 - 36.0 g/dL   RDW 92.3 30.0 - 76.2 %   Platelets 353 150 - 400 K/uL   nRBC 0.0 0.0 - 0.2 %   Neutrophils Relative % 84 %   Neutro Abs 18.1 (H) 1.7 - 7.7 K/uL   Lymphocytes Relative 9 %   Lymphs Abs 1.9 0.7 - 4.0 K/uL   Monocytes Relative 5 %   Monocytes Absolute 1.1 (H) 0.1 - 1.0 K/uL   Eosinophils Relative 1 %   Eosinophils Absolute 0.2 0.0 - 0.5 K/uL   Basophils Relative 0 %   Basophils Absolute 0.1 0.0 - 0.1 K/uL   Immature Granulocytes 1 %   Abs Immature Granulocytes 0.18 (H) 0.00 - 0.07 K/uL    Comment: Performed at [facility], [address] [place], [place] [address]  HIV Antibody (routine testing w rflx)     Status: None   Collection Time: 10/14/21 12:12 AM  Result Value Ref Range   HIV Screen 4th Generation wRfx Non Reactive Non Reactive    Comment: Performed at [facility], [address] [place], [place] [address]  Basic metabolic panel     Status: Abnormal   Collection Time: 10/14/21  4:47 AM  Result Value Ref Range   Sodium 139 135 - 145 mmol/L   Potassium 4.5 3.5 - 5.1 mmol/L   Chloride 114 (H) 98 - 111 mmol/L   CO2 21 (L) 22 - 32 mmol/L   Glucose, Bld 139 (H) 70 - 99 mg/dL    Comment: Glucose reference range applies only to samples taken after fasting for at least 8 hours.   BUN 14 6 - 20  mg/dL   Creatinine, Ser 1.74 0.44 - 1.00 mg/dL   Calcium 8.0 (L) 8.9 - 10.3 mg/dL   GFR, Estimated >08 >14 mL/min    Comment: (NOTE) Calculated using the CKD-EPI Creatinine Equation (2021)    Anion gap 4 (L)  5 - 15    Comment: Performed at [facility], [address] [place], [place] [address]  CBC     Status: Abnormal   Collection Time: 10/14/21  4:47 AM  Result Value Ref Range   WBC 13.5 (H) 4.0 - 10.5 K/uL   RBC 3.87 3.87 - 5.11 MIL/uL   Hemoglobin 11.2 (L) 12.0 - 15.0 g/dL   HCT 63.1 (L) 49.7 - 02.6 %   MCV 84.2 80.0 - 100.0 fL   MCH 28.9 26.0 - 34.0 pg   MCHC 34.4 30.0 - 36.0 g/dL   RDW 37.8 58.8 - 50.2 %   Platelets 274 150 - 400 K/uL   nRBC 0.0 0.0 - 0.2 %    Comment: Performed at [facility], [address], [place], [place] [address]  . CT Soft Tissue Neck W Contrast  Result Date: 10/13/2021 CLINICAL DATA:  Epiglottitis or tonsillitis suspected. Sore throat and rhinorrhea. EXAM: CT NECK WITH CONTRAST TECHNIQUE: Multidetector CT imaging of the neck was performed using the standard protocol following the bolus administration of intravenous contrast. RADIATION DOSE REDUCTION: This exam was performed according to the departmental dose-optimization program which includes automated exposure control, adjustment of the mA and/or kV according to patient size and/or use of iterative reconstruction technique. CONTRAST:  67mL OMNIPAQUE IOHEXOL 300 MG/ML  SOLN COMPARISON:  Chest CTA 05/19/2020. FINDINGS: Pharynx and larynx: Mild epiglottic edema with more focal, rounded 9 mm focus along the superior aspect of the epiglottis in the midline potentially reflecting a superimposed cyst or more focal edema. Symmetrically enlarged soft tissue at the base of the tongue likely reflecting hyperplasia of the lingual tonsils. Widely patent airway. No retropharyngeal fluid collection. Salivary glands: No inflammation, mass, or stone. Thyroid: Unremarkable. Lymph nodes: Mildly prominent level II lymph nodes bilaterally measuring up to 1 cm in short axis, likely benign/reactive. Vascular: Major vascular structures of the neck are grossly patent. Limited intracranial: Unremarkable. Visualized orbits:  Unremarkable. Mastoids and visualized paranasal sinuses: Mild mucosal thickening in the paranasal sinuses. Included mastoid air cells are clear. Skeleton: No acute osseous abnormality or suspicious osseous lesion. Upper chest: Partially visualized nodule in the superior segment of the right lower lobe measuring at least 9 mm in size (series 4, image 109) and corresponding to a 13 mm nodule on the prior chest CTA. Mild paraseptal emphysema in the lung apices. Residual thymic tissue in the anterior mediastinum. Other: 2.3 cm cutaneous/subcutaneous cyst in the posterior left upper neck, likely an epidermal inclusion cyst. IMPRESSION: 1. Thickening of the epiglottis and lingual tonsils suggestive of supraglottitis. 2. Partially visualized persistent right lower lobe pulmonary nodule. Recommend follow-up chest CT in 12 months. 3. Emphysema (ICD10-J43.9). Electronically Signed   By: Sebastian Ache M.D.   On: 10/13/2021 20:12  .   ASSESSMENT: Lingual tonsillitis/supraglottitis with improvement on IV antibiotics and steroids.  Notable that I do not see where routine antibiotic dosing was ordered, as best I can tell she had 1 dose of Unasyn.  Despite the improvement I would strongly recommend continuing antibiotics, either Augmentin 875 mg p.o. twice daily or clindamycin 300 mg p.o. 4 times daily for 10 days.  I would also recommend she get further IV antibiotics prior to  discharge given the severity of the infection. PLAN: Improved and will sign off but recommend continued antibiotic management and steroids.  Was discharged the above antibiotic options and a Sterapred DS 6-day taper would be appropriate.  I am happy to see her in follow-up in the office.   [name], MD 10/14/2021 2:00 PM Patient ID: Gabrielle Harmon, female   DOB: Nov 03, 1985, 36 y.o.   MRN: [id number]

## 2022-04-12 ENCOUNTER — Ambulatory Visit: Payer: PRIVATE HEALTH INSURANCE | Admitting: Dermatology

## 2022-04-18 ENCOUNTER — Other Ambulatory Visit: Payer: Self-pay | Admitting: Internal Medicine

## 2022-04-18 DIAGNOSIS — G5603 Carpal tunnel syndrome, bilateral upper limbs: Secondary | ICD-10-CM

## 2022-05-16 ENCOUNTER — Other Ambulatory Visit: Payer: Self-pay | Admitting: Internal Medicine

## 2022-05-16 DIAGNOSIS — G5603 Carpal tunnel syndrome, bilateral upper limbs: Secondary | ICD-10-CM

## 2022-05-25 ENCOUNTER — Encounter: Payer: Self-pay | Admitting: Internal Medicine

## 2022-05-29 ENCOUNTER — Ambulatory Visit (INDEPENDENT_AMBULATORY_CARE_PROVIDER_SITE_OTHER): Payer: BC Managed Care – PPO | Admitting: Internal Medicine

## 2022-05-29 ENCOUNTER — Encounter: Payer: Self-pay | Admitting: Internal Medicine

## 2022-05-29 VITALS — BP 112/70 | HR 88 | Temp 97.6°F | Ht 63.0 in | Wt 183.2 lb

## 2022-05-29 DIAGNOSIS — F17200 Nicotine dependence, unspecified, uncomplicated: Secondary | ICD-10-CM

## 2022-05-29 DIAGNOSIS — E669 Obesity, unspecified: Secondary | ICD-10-CM | POA: Diagnosis not present

## 2022-05-29 DIAGNOSIS — G5603 Carpal tunnel syndrome, bilateral upper limbs: Secondary | ICD-10-CM

## 2022-05-29 DIAGNOSIS — R0789 Other chest pain: Secondary | ICD-10-CM | POA: Diagnosis not present

## 2022-05-29 DIAGNOSIS — F17210 Nicotine dependence, cigarettes, uncomplicated: Secondary | ICD-10-CM | POA: Diagnosis not present

## 2022-05-29 MED ORDER — PHENDIMETRAZINE TARTRATE 35 MG PO TABS: 35.0000 mg | ORAL_TABLET | Freq: Three times a day (TID) | ORAL | 1 refills | Status: DC

## 2022-05-29 MED ORDER — TRAMADOL HCL 50 MG PO TABS: 50.0000 mg | ORAL_TABLET | Freq: Four times a day (QID) | ORAL | 0 refills | Status: DC | PRN

## 2022-05-29 NOTE — Progress Notes (Signed)
Established Patient Office Visit  Subjective:  Patient ID: Gabrielle Harmon, female    DOB: [birth year]  Age: 37 y.o. MRN: [id number]  Chief Complaint  Patient presents with   Follow-up    2 month follow up    C/o episode of left chest pain at work 4 days ago, EMS called, did an EKG which was normal and declined to take her to the ER. She admits to feeling anxious but still complains of mild chest pain.    No other concerns at this time.   Past Medical History:  Diagnosis Date   Depression     Past Surgical History:  Procedure Laterality Date   adenocetomy     ESOPHAGOGASTRODUODENOSCOPY ENDOSCOPY     TUBAL LIGATION      Social History   Socioeconomic History   Marital status: Married    Spouse name: Not on file   Number of children: 3   Years of education: Not on file   Highest education level: Not on file  Occupational History   Occupation: warehouse  Tobacco Use   Smoking status: Every Day    Packs/day: 1.00    Years: 20.00    Additional pack years: 0.00    Total pack years: 20.00    Types: Cigarettes   Smokeless tobacco: Never  Vaping Use   Vaping Use: Never used  Substance and Sexual Activity   Alcohol use: No   Drug use: No   Sexual activity: Yes    Birth control/protection: Surgical  Other Topics Concern   Not on file  Social History Narrative   Not on file   Social Determinants of Health   Financial Resource Strain: Not on file  Food Insecurity: Not on file  Transportation Needs: Not on file  Physical Activity: Not on file  Stress: Not on file  Social Connections: Not on file  Intimate Partner Violence: Not on file    Family History  Problem Relation Age of Onset   Diabetes Paternal Aunt     No Known Allergies  Review of Systems  Constitutional: Negative.   HENT: Negative.    Eyes: Negative.   Respiratory: Negative.    Cardiovascular:  Positive for chest pain. Negative for palpitations and orthopnea.  Gastrointestinal: Negative.    Genitourinary: Negative.   Skin: Negative.   Neurological: Negative.   Endo/Heme/Allergies: Negative.        Objective:   BP 112/70   Pulse 88   Temp 97.6 F (36.4 C) (Tympanic)   Ht 5\' 3"  (1.6 m)   Wt 183 lb 3.2 oz (83.1 kg)   SpO2 97%   BMI 32.45 kg/m   Vitals:   05/29/22 1530  BP: 112/70  Pulse: 88  Temp: 97.6 F (36.4 C)  Height: 5\' 3"  (1.6 m)  Weight: 183 lb 3.2 oz (83.1 kg)  SpO2: 97%  TempSrc: Tympanic  BMI (Calculated): 32.46    Physical Exam Vitals reviewed.  Constitutional:      General: She is not in acute distress. HENT:     Head: Normocephalic.     Nose: Nose normal.     Mouth/Throat:     Mouth: Mucous membranes are moist.  Eyes:     Extraocular Movements: Extraocular movements intact.     Pupils: Pupils are equal, round, and reactive to light.  Cardiovascular:     Rate and Rhythm: Normal rate and regular rhythm.     Heart sounds: No murmur heard. Pulmonary:     Effort:  Pulmonary effort is normal.     Breath sounds: No rhonchi or rales.  Abdominal:     General: Abdomen is flat.     Palpations: There is no hepatomegaly, splenomegaly or mass.  Musculoskeletal:        General: Normal range of motion.     Cervical back: Normal range of motion. No tenderness.  Skin:    General: Skin is warm and dry.  Neurological:     General: No focal deficit present.     Mental Status: She is alert and oriented to person, place, and time.     Cranial Nerves: No cranial nerve deficit.     Motor: No weakness.  Psychiatric:        Mood and Affect: Mood normal.        Behavior: Behavior normal.      No results found for any visits on 05/29/22.  No results found for this or any previous visit (from the past 2160 hour(Danitra Payano)).    Assessment & Plan:   Problem List Items Addressed This Visit   None   No follow-ups on file.   Total time spent: 20 minutes  Volanda Napoleon, MD  05/29/2022

## 2022-05-31 ENCOUNTER — Ambulatory Visit (INDEPENDENT_AMBULATORY_CARE_PROVIDER_SITE_OTHER): Payer: BC Managed Care – PPO | Admitting: Cardiovascular Disease

## 2022-05-31 ENCOUNTER — Encounter: Payer: Self-pay | Admitting: Cardiovascular Disease

## 2022-05-31 VITALS — BP 108/70 | HR 78 | Ht 63.0 in | Wt 182.8 lb

## 2022-05-31 DIAGNOSIS — F17210 Nicotine dependence, cigarettes, uncomplicated: Secondary | ICD-10-CM | POA: Diagnosis not present

## 2022-05-31 DIAGNOSIS — R0789 Other chest pain: Secondary | ICD-10-CM | POA: Diagnosis not present

## 2022-05-31 DIAGNOSIS — F17200 Nicotine dependence, unspecified, uncomplicated: Secondary | ICD-10-CM

## 2022-05-31 DIAGNOSIS — R0602 Shortness of breath: Secondary | ICD-10-CM | POA: Diagnosis not present

## 2022-05-31 DIAGNOSIS — E669 Obesity, unspecified: Secondary | ICD-10-CM | POA: Diagnosis not present

## 2022-05-31 NOTE — Progress Notes (Signed)
Cardiology Office Note   Date:  05/31/2022   ID:  Gabrielle Harmon, DOB Jul 09, 1985, MRN [id number]  PCP:  [facility]  Cardiologist:  Neoma Laming, MD      History of Present Illness: Gabrielle Harmon is a 37 y.o. female who presents for  Chief Complaint  Patient presents with   Establish Care    Cardiac consult   Had severe chest pain last Friday. EMS was called and had normal EKG when seen on 05/20/22  Chest Pain  This is a new problem. The current episode started 1 to 4 weeks ago. The onset quality is sudden. The problem occurs 2 to 4 times per day. The problem has been gradually improving. The pain is at a severity of 6/10. The pain is severe. The quality of the pain is described as tightness. Associated symptoms include nausea.      Past Medical History:  Diagnosis Date   Depression      Past Surgical History:  Procedure Laterality Date   adenocetomy     ESOPHAGOGASTRODUODENOSCOPY ENDOSCOPY     TUBAL LIGATION       Current Outpatient Medications  Medication Sig Dispense Refill   Ibuprofen (IBU PO) Take 400 mg by mouth daily as needed for headache, fever or mild pain.  0   [START ON 06/15/2022] traMADol (ULTRAM) 50 MG tablet Take 1 tablet (50 mg total) by mouth every 6 (six) hours as needed. 120 tablet 0   nicotine (NICODERM CQ - DOSED IN MG/24 HOURS) 14 mg/24hr patch Place 1 patch (14 mg total) onto the skin daily. (Patient not taking: Reported on 05/29/2022) 28 patch 0   Phendimetrazine Tartrate 35 MG TABS Take 1 tablet (35 mg total) by mouth in the morning, at noon, and at bedtime. (Patient not taking: Reported on 05/31/2022) 90 tablet 1   predniSONE (STERAPRED UNI-PAK 21 TAB) 10 MG (21) TBPK tablet Adult Oral dosage (Sterapred 5 mg tablets or Sterapred-DS 10 mg tablets, 21-tablet dose pack): Day 1: 2 tablets PO before breakfast, 1 tablet PO after lunch, 1 tablet PO after supper, and 2 tablets PO at bedtime. Day 2: 1 tablet PO before breakfast. 1 tablet PO  after lunch, 1 tablet PO after supper, and 2 tablets PO at bedtime. Day 3: 1 tablet PO before breakfast, 1 tablet PO after lunch, 1 tablet PO after supper, and 1 tablet PO at bedtime. Day 4: 1 tablet PO before breakfast, 1 tablet PO after lunch, and 1 tablet PO at bedtime. Day 5: 1 tablet PO before breakfast, and 1 tablet PO at bedtime. Day 6: 1 tablet PO before breakfast. (Patient not taking: Reported on 05/29/2022) 21 tablet 0   WEGOVY 2.4 MG/0.75ML SOAJ Inject 2.4 mg into the skin every 7 (seven) days. (Patient not taking: Reported on 05/29/2022)     No current facility-administered medications for this visit.    Allergies:   Patient has no known allergies.    Social History:   reports that she has been smoking cigarettes. She has a 20.00 pack-year smoking history. She has never used smokeless tobacco. She reports that she does not drink alcohol and does not use drugs.   Family History:  family history includes Diabetes in her paternal aunt.    ROS:     Review of Systems  Constitutional: Negative.   HENT: Negative.    Eyes: Negative.   Respiratory: Negative.    Cardiovascular:  Positive for chest pain.  Gastrointestinal:  Positive for  nausea.  Genitourinary: Negative.   Musculoskeletal: Negative.   Skin: Negative.   Neurological: Negative.   Endo/Heme/Allergies: Negative.   Psychiatric/Behavioral: Negative.    All other systems reviewed and are negative.     All other systems are reviewed and negative.    PHYSICAL EXAM: VS:  BP 108/70   Pulse 78   Ht 5\' 3"  (1.6 m)   Wt 182 lb 12.8 oz (82.9 kg)   SpO2 97%   BMI 32.38 kg/m  , BMI Body mass index is 32.38 kg/m. Last weight:  Wt Readings from Last 3 Encounters:  05/31/22 182 lb 12.8 oz (82.9 kg)  05/29/22 183 lb 3.2 oz (83.1 kg)  10/13/21 160 lb (72.6 kg)     Physical Exam Constitutional:      Appearance: Normal appearance.  Cardiovascular:     Rate and Rhythm: Normal rate and regular rhythm.     Heart sounds:  Normal heart sounds.  Pulmonary:     Effort: Pulmonary effort is normal.     Breath sounds: Normal breath sounds.  Musculoskeletal:     Right lower leg: No edema.     Left lower leg: No edema.  Neurological:     Mental Status: She is alert.       EKG: NSR no acute changes  Recent Labs: 10/14/2021: BUN 14; Creatinine, Ser 0.53; Hemoglobin 11.2; Platelets 274; Potassium 4.5; Sodium 139    Lipid Panel No results found for: "CHOL", "TRIG", "HDL", "CHOLHDL", "VLDL", "LDLCALC", "LDLDIRECT"    Other studies Reviewed: Additional studies/ records that were reviewed today include:  Review of the above records demonstrates:       No data to display            ASSESSMENT AND PLAN:    ICD-10-CM   1. Obesity (BMI 30-39.9)  E66.9 PCV ECHOCARDIOGRAM COMPLETE    MYOCARDIAL PERFUSION IMAGING    2. Tobacco dependence  F17.200 PCV ECHOCARDIOGRAM COMPLETE    MYOCARDIAL PERFUSION IMAGING    3. Other chest pain  R07.89 PCV ECHOCARDIOGRAM COMPLETE    MYOCARDIAL PERFUSION IMAGING   seen by EMS,for chest pain , EKG normal, and was refered by Dr. Westley Gambles. Will do echo/stress test    4. SOB (shortness of breath)  R06.02 PCV ECHOCARDIOGRAM COMPLETE    MYOCARDIAL PERFUSION IMAGING       Problem List Items Addressed This Visit       Other   Tobacco dependence (Chronic)   Relevant Orders   PCV ECHOCARDIOGRAM COMPLETE   MYOCARDIAL PERFUSION IMAGING   Obesity (BMI 30-39.9) - Primary   Relevant Orders   PCV ECHOCARDIOGRAM COMPLETE   MYOCARDIAL PERFUSION IMAGING   Other chest pain   Relevant Orders   PCV ECHOCARDIOGRAM COMPLETE   MYOCARDIAL PERFUSION IMAGING   SOB (shortness of breath)   Relevant Orders   PCV ECHOCARDIOGRAM COMPLETE   MYOCARDIAL PERFUSION IMAGING       Disposition:   Return in about 1 week (around 06/07/2022) for after echo, stress test.    Total time spent: 45 minutes  Signed,  Neoma Laming, MD  05/31/2022 3:58 [place]

## 2022-06-03 ENCOUNTER — Encounter: Payer: Self-pay | Admitting: Internal Medicine

## 2022-06-05 ENCOUNTER — Other Ambulatory Visit: Payer: BC Managed Care – PPO

## 2022-06-25 ENCOUNTER — Other Ambulatory Visit: Payer: BC Managed Care – PPO

## 2022-07-03 ENCOUNTER — Ambulatory Visit (INDEPENDENT_AMBULATORY_CARE_PROVIDER_SITE_OTHER): Payer: BC Managed Care – PPO

## 2022-07-03 DIAGNOSIS — R0602 Shortness of breath: Secondary | ICD-10-CM

## 2022-07-03 DIAGNOSIS — I340 Nonrheumatic mitral (valve) insufficiency: Secondary | ICD-10-CM

## 2022-07-03 DIAGNOSIS — R0789 Other chest pain: Secondary | ICD-10-CM

## 2022-07-03 DIAGNOSIS — E669 Obesity, unspecified: Secondary | ICD-10-CM

## 2022-07-03 DIAGNOSIS — F17200 Nicotine dependence, unspecified, uncomplicated: Secondary | ICD-10-CM

## 2022-07-09 ENCOUNTER — Ambulatory Visit (INDEPENDENT_AMBULATORY_CARE_PROVIDER_SITE_OTHER): Payer: BC Managed Care – PPO

## 2022-07-09 DIAGNOSIS — R0789 Other chest pain: Secondary | ICD-10-CM

## 2022-07-09 DIAGNOSIS — R0602 Shortness of breath: Secondary | ICD-10-CM

## 2022-07-09 DIAGNOSIS — F17200 Nicotine dependence, unspecified, uncomplicated: Secondary | ICD-10-CM

## 2022-07-09 DIAGNOSIS — E669 Obesity, unspecified: Secondary | ICD-10-CM

## 2022-07-09 MED ORDER — TECHNETIUM TC 99M SESTAMIBI GENERIC - CARDIOLITE
32.5000 | Freq: Once | INTRAVENOUS | Status: AC | PRN
Start: 2022-07-09 — End: 2022-07-09
  Administered 2022-07-09: 32.5 via INTRAVENOUS

## 2022-07-09 MED ORDER — TECHNETIUM TC 99M SESTAMIBI GENERIC - CARDIOLITE
10.4000 | Freq: Once | INTRAVENOUS | Status: AC | PRN
Start: 2022-07-09 — End: 2022-07-09
  Administered 2022-07-09: 10.4 via INTRAVENOUS

## 2022-07-10 ENCOUNTER — Ambulatory Visit: Payer: BC Managed Care – PPO | Admitting: Cardiovascular Disease

## 2022-07-10 ENCOUNTER — Encounter: Payer: Self-pay | Admitting: Cardiovascular Disease

## 2022-07-10 ENCOUNTER — Other Ambulatory Visit: Payer: Self-pay | Admitting: Internal Medicine

## 2022-07-10 VITALS — BP 94/72 | HR 95 | Ht 64.0 in | Wt 186.0 lb

## 2022-07-10 DIAGNOSIS — E669 Obesity, unspecified: Secondary | ICD-10-CM | POA: Diagnosis not present

## 2022-07-10 DIAGNOSIS — G5603 Carpal tunnel syndrome, bilateral upper limbs: Secondary | ICD-10-CM

## 2022-07-10 DIAGNOSIS — F17209 Nicotine dependence, unspecified, with unspecified nicotine-induced disorders: Secondary | ICD-10-CM

## 2022-07-10 DIAGNOSIS — R0789 Other chest pain: Secondary | ICD-10-CM | POA: Diagnosis not present

## 2022-07-10 DIAGNOSIS — F17200 Nicotine dependence, unspecified, uncomplicated: Secondary | ICD-10-CM

## 2022-07-10 MED ORDER — PANTOPRAZOLE SODIUM 40 MG PO TBEC: 40.0000 mg | DELAYED_RELEASE_TABLET | Freq: Every day | ORAL | 1 refills | Status: DC

## 2022-07-10 NOTE — Progress Notes (Signed)
Cardiology Office Note   Date:  07/10/2022   ID:  Gabrielle, Harmon March 21, 1985, MRN [id number]  PCP:  [facility]  Cardiologist:  Adrian Blackwater, MD      History of Present Illness: ARIYANNAH Harmon is a 37 y.o. female who presents for  Chief Complaint  Patient presents with   Follow-up    NST, Echo results.    Patient in office to discuss results of echo and stress test. Denies chest pain, shortness of breath.    Past Medical History:  Diagnosis Date   Depression      Past Surgical History:  Procedure Laterality Date   adenocetomy     ESOPHAGOGASTRODUODENOSCOPY ENDOSCOPY     TUBAL LIGATION       Current Outpatient Medications  Medication Sig Dispense Refill   traMADol (ULTRAM) 50 MG tablet TAKE 1 TABLET(50 MG) BY MOUTH EVERY 6 HOURS AS NEEDED 120 tablet 0   pantoprazole (PROTONIX) 40 MG tablet Take 1 tablet (40 mg total) by mouth daily. 30 tablet 1   No current facility-administered medications for this visit.    Allergies:   Patient has no known allergies.    Social History:   reports that she has been smoking cigarettes. She has a 20.00 pack-year smoking history. She has never used smokeless tobacco. She reports that she does not drink alcohol and does not use drugs.   Family History:  family history includes Diabetes in her paternal aunt.    ROS:     Review of Systems  Constitutional: Negative.   HENT: Negative.    Eyes: Negative.   Respiratory: Negative.    Cardiovascular: Negative.   Gastrointestinal: Negative.   Genitourinary: Negative.   Musculoskeletal: Negative.   Skin: Negative.   Neurological: Negative.   Endo/Heme/Allergies: Negative.   Psychiatric/Behavioral: Negative.    All other systems reviewed and are negative.   All other systems are reviewed and negative.   PHYSICAL EXAM: VS:  BP 94/72   Pulse 95   Ht 5\' 4"  (1.626 m)   Wt 186 lb (84.4 kg)   SpO2 97%   BMI 31.93 kg/m  , BMI Body mass index is 31.93  kg/m. Last weight:  Wt Readings from Last 3 Encounters:  07/10/22 186 lb (84.4 kg)  05/31/22 182 lb 12.8 oz (82.9 kg)  05/29/22 183 lb 3.2 oz (83.1 kg)    Physical Exam Constitutional:      Appearance: Normal appearance.  Cardiovascular:     Rate and Rhythm: Normal rate and regular rhythm.     Heart sounds: Normal heart sounds.  Pulmonary:     Effort: Pulmonary effort is normal.     Breath sounds: Normal breath sounds.  Musculoskeletal:     Right lower leg: No edema.     Left lower leg: No edema.  Neurological:     Mental Status: She is alert.     EKG: none today  Recent Labs: 10/14/2021: BUN 14; Creatinine, Ser 0.53; Hemoglobin 11.2; Platelets 274; Potassium 4.5; Sodium 139    Lipid Panel No results found for: "CHOL", "TRIG", "HDL", "CHOLHDL", "VLDL", "LDLCALC", "LDLDIRECT"    Other studies Reviewed: echo, stress test   ASSESSMENT AND PLAN:    ICD-10-CM   1. Other chest pain  R07.89 pantoprazole (PROTONIX) 40 MG tablet    2. Tobacco dependence  F17.200     3. Obesity (BMI 30-39.9)  E66.9        Problem List Items Addressed This Visit  Other   Tobacco dependence (Chronic)    Smoking cessation instruction/counseling given:  counseled patient on the dangers of tobacco use, advised patient to stop smoking, and reviewed strategies to maximize success       Obesity (BMI 30-39.9)    The patient is asked to make an attempt to improve diet and exercise patterns to aid in medical management of this problem.      Other chest pain - Primary    Patient experiencing non-cardiac chest pain. Stress test and echocardiogram normal. Will add Protonix for acid reflux.       Relevant Medications   pantoprazole (PROTONIX) 40 MG tablet    Disposition:   Return if symptoms worsen or fail to improve.    Total time spent: 30 minutes  Signed,  Adrian Blackwater, MD  07/10/2022 1:49 PM    [facility]

## 2022-07-10 NOTE — Assessment & Plan Note (Signed)
Patient experiencing non-cardiac chest pain. Stress test and echocardiogram normal. Will add Protonix for acid reflux.

## 2022-07-10 NOTE — Assessment & Plan Note (Signed)
Smoking cessation instruction/counseling given:  counseled patient on the dangers of tobacco use, advised patient to stop smoking, and reviewed strategies to maximize success 

## 2022-07-10 NOTE — Assessment & Plan Note (Signed)
The patient is asked to make an attempt to improve diet and exercise patterns to aid in medical management of this problem.  

## 2022-07-30 ENCOUNTER — Ambulatory Visit: Payer: BC Managed Care – PPO | Admitting: Internal Medicine

## 2022-08-03 ENCOUNTER — Other Ambulatory Visit: Payer: Self-pay

## 2022-08-03 ENCOUNTER — Other Ambulatory Visit: Payer: Self-pay | Admitting: Internal Medicine

## 2022-08-03 MED ORDER — SAXENDA 18 MG/3ML ~~LOC~~ SOPN: 1.8000 mg | PEN_INJECTOR | Freq: Every day | SUBCUTANEOUS | 1 refills | Status: DC

## 2022-08-08 ENCOUNTER — Other Ambulatory Visit: Payer: Self-pay | Admitting: Internal Medicine

## 2022-08-08 DIAGNOSIS — G5603 Carpal tunnel syndrome, bilateral upper limbs: Secondary | ICD-10-CM

## 2022-08-24 ENCOUNTER — Ambulatory Visit: Payer: BC Managed Care – PPO | Admitting: Internal Medicine

## 2022-08-27 ENCOUNTER — Other Ambulatory Visit: Payer: Self-pay | Admitting: Internal Medicine

## 2022-08-27 ENCOUNTER — Other Ambulatory Visit: Payer: Self-pay

## 2022-08-28 ENCOUNTER — Other Ambulatory Visit: Payer: Self-pay | Admitting: Internal Medicine

## 2022-08-31 ENCOUNTER — Ambulatory Visit: Payer: BC Managed Care – PPO | Admitting: Internal Medicine

## 2022-08-31 ENCOUNTER — Encounter: Payer: Self-pay | Admitting: Internal Medicine

## 2022-08-31 VITALS — BP 116/60 | HR 109 | Ht 64.0 in | Wt 168.0 lb

## 2022-08-31 DIAGNOSIS — F17200 Nicotine dependence, unspecified, uncomplicated: Secondary | ICD-10-CM

## 2022-08-31 DIAGNOSIS — G5603 Carpal tunnel syndrome, bilateral upper limbs: Secondary | ICD-10-CM | POA: Diagnosis not present

## 2022-08-31 DIAGNOSIS — E669 Obesity, unspecified: Secondary | ICD-10-CM

## 2022-08-31 MED ORDER — TRAMADOL HCL 50 MG PO TABS
ORAL_TABLET | ORAL | 0 refills | Status: DC
Start: 2022-09-08 — End: 2022-10-05

## 2022-08-31 NOTE — Progress Notes (Signed)
Established Patient Office Visit  Subjective:  Patient ID: Gabrielle Harmon, female    DOB: May 17, 1985  Age: 37 y.o. MRN: [id number]  Chief Complaint  Patient presents with   Follow-up    2 months follow up    No new complaints, here for lab review and medication refills. Chronic pain ISQ, lost 11 lbs since January on diet with Saxenda and exercise. No further CP and work up negative per Cardiology.     No other concerns at this time.   Past Medical History:  Diagnosis Date   Depression     Past Surgical History:  Procedure Laterality Date   adenocetomy     ESOPHAGOGASTRODUODENOSCOPY ENDOSCOPY     TUBAL LIGATION      Social History   Socioeconomic History   Marital status: Married    Spouse name: Not on file   Number of children: 3   Years of education: Not on file   Highest education level: Not on file  Occupational History   Occupation: warehouse  Tobacco Use   Smoking status: Every Day    Packs/day: 1.00    Years: 20.00    Additional pack years: 0.00    Total pack years: 20.00    Types: Cigarettes   Smokeless tobacco: Never  Vaping Use   Vaping Use: Never used  Substance and Sexual Activity   Alcohol use: No   Drug use: No   Sexual activity: Yes    Birth control/protection: Surgical  Other Topics Concern   Not on file  Social History Narrative   Not on file   Social Determinants of Health   Financial Resource Strain: Not on file  Food Insecurity: Not on file  Transportation Needs: Not on file  Physical Activity: Not on file  Stress: Not on file  Social Connections: Not on file  Intimate Partner Violence: Not on file    Family History  Problem Relation Age of Onset   Diabetes Paternal Aunt     No Known Allergies  Review of Systems  Constitutional: Negative.   HENT: Negative.    Eyes: Negative.   Respiratory: Negative.    Cardiovascular:  Negative for chest pain, palpitations and orthopnea.  Gastrointestinal: Negative.    Genitourinary: Negative.   Skin: Negative.   Neurological: Negative.   Endo/Heme/Allergies: Negative.        Objective:   BP 116/60   Pulse (!) 109   Ht 5\' 4"  (1.626 m)   Wt 168 lb (76.2 kg)   SpO2 95%   BMI 28.84 kg/m   Vitals:   08/31/22 1419  BP: 116/60  Pulse: (!) 109  Height: 5\' 4"  (1.626 m)  Weight: 168 lb (76.2 kg)  SpO2: 95%  BMI (Calculated): 28.82    Physical Exam Vitals reviewed.  Constitutional:      General: She is not in acute distress.    Appearance: She is overweight.  HENT:     Head: Normocephalic.     Nose: Nose normal.     Mouth/Throat:     Mouth: Mucous membranes are moist.  Eyes:     Extraocular Movements: Extraocular movements intact.     Pupils: Pupils are equal, round, and reactive to light.  Cardiovascular:     Rate and Rhythm: Normal rate and regular rhythm.     Heart sounds: No murmur heard. Pulmonary:     Effort: Pulmonary effort is normal.     Breath sounds: No rhonchi or rales.  Abdominal:  General: Abdomen is flat.     Palpations: There is no hepatomegaly, splenomegaly or mass.  Musculoskeletal:        General: Normal range of motion.     Cervical back: Normal range of motion. No tenderness.  Skin:    General: Skin is warm and dry.  Neurological:     General: No focal deficit present.     Mental Status: She is alert and oriented to person, place, and time.     Cranial Nerves: No cranial nerve deficit.     Motor: No weakness.  Psychiatric:        Mood and Affect: Mood normal.        Behavior: Behavior normal.      No results found for any visits on 08/31/22.  No results found for this or any previous visit (from the past 2160 hour(Terrell Shimko)).    Assessment & Plan:  As per problem list.  Problem List Items Addressed This Visit       Nervous and Auditory   Bilateral carpal tunnel syndrome   Relevant Medications   traMADol (ULTRAM) 50 MG tablet (Start on 09/08/2022)     Other   Tobacco dependence (Chronic)    Obesity (BMI 30-39.9) - Primary    Return in about 2 months (around 10/31/2022).   Total time spent: 20 minutes  Luna Fuse, MD  08/31/2022   This document may have been prepared by [facility] Voice Recognition software and as such may include unintentional dictation errors.

## 2022-09-06 IMAGING — CT CT ANGIO CHEST
2 of 6 series · 18 of 46 positions shown · IV contrast (omnipaque)
Comparison: None.

CLINICAL DATA: Pulmonary embolism, chest pain

EXAM:
CT ANGIOGRAPHY CHEST WITH CONTRAST
TECHNIQUE: Multidetector CT imaging of the chest was performed using the
standard protocol during bolus administration of intravenous
contrast. Multiplanar CT image reconstructions and MIPs were
obtained to evaluate the vascular anatomy.
CONTRAST:  75mL OMNIPAQUE IOHEXOL 350 MG/ML SOLN

[Series 5: thins · axial · 0.64mm/px · z∈[-269,-27]mm · 15 of 266 slices shown]
[im 12/266  lung]
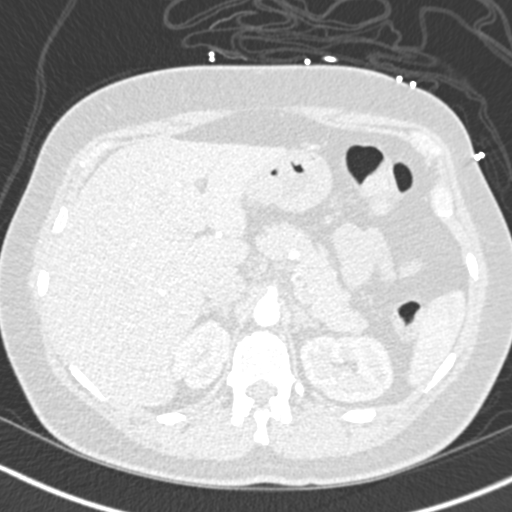
[im 35/266  soft-tissue]
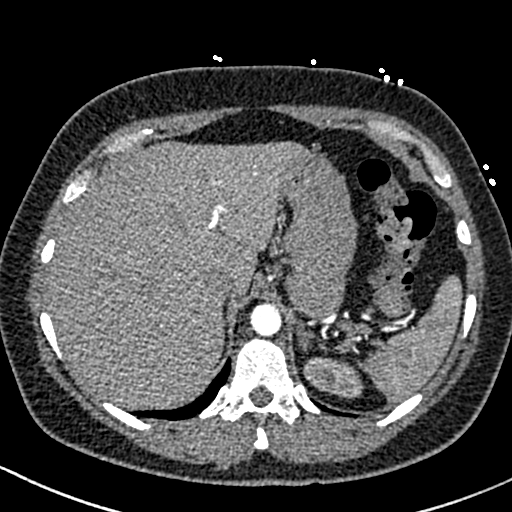
[im 47/266  lung]
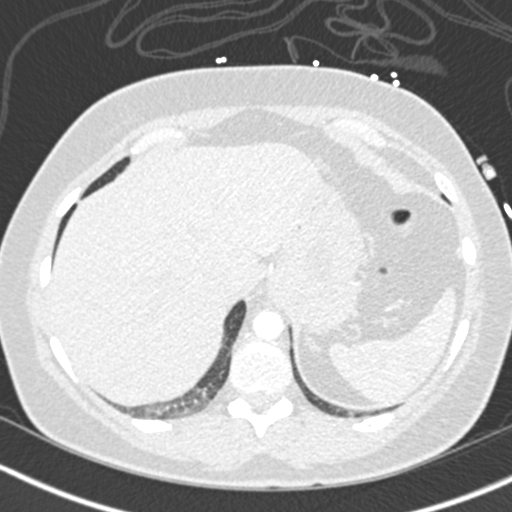
[im 70/266  soft-tissue]
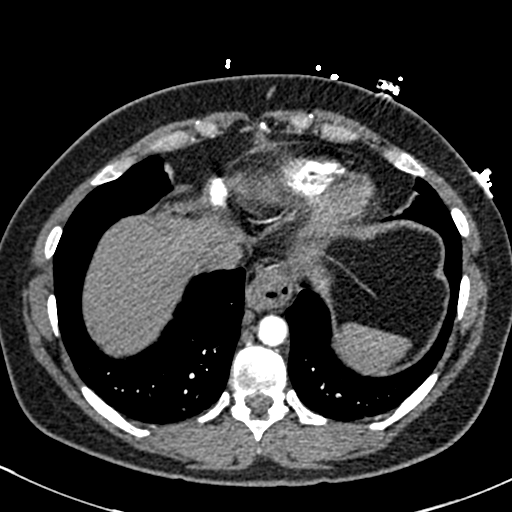
[im 81/266  lung]
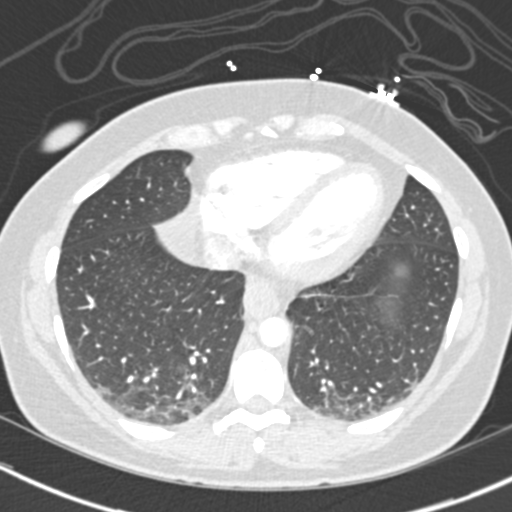
[im 104/266  soft-tissue]
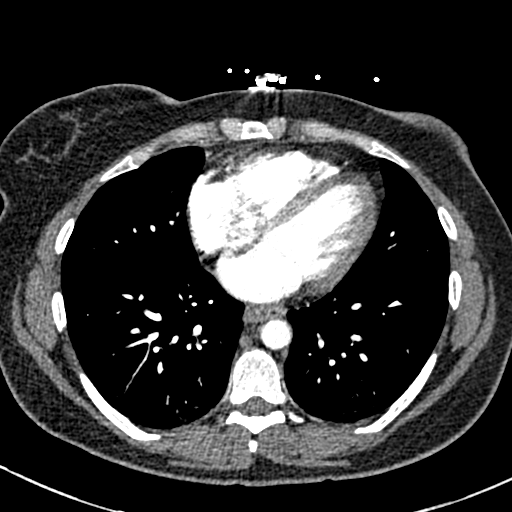
[im 116/266  lung]
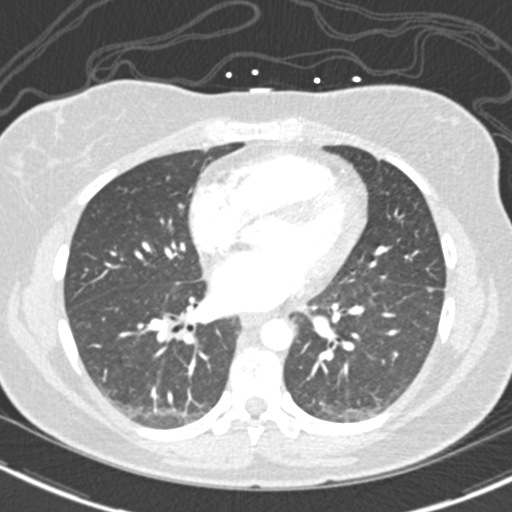
[im 139/266  soft-tissue]
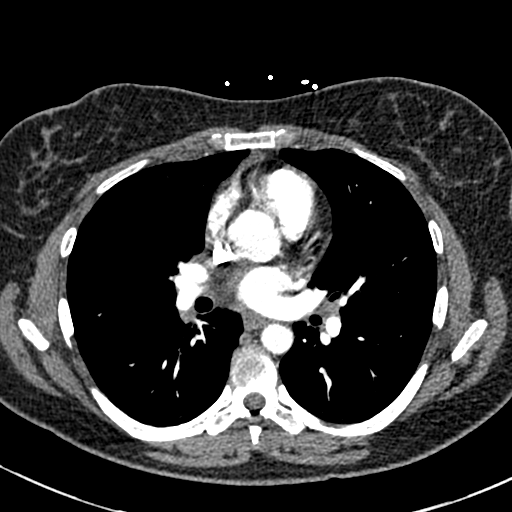
[im 150/266  lung]
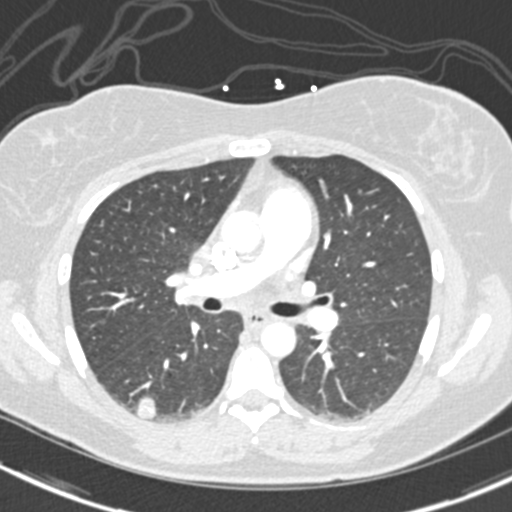
[im 162/266  soft-tissue]
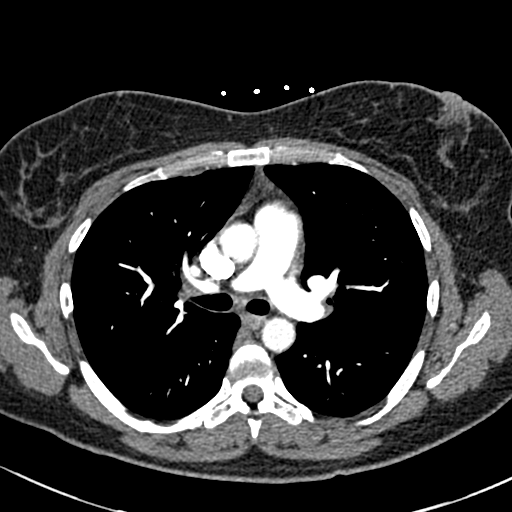
[im 185/266  lung]
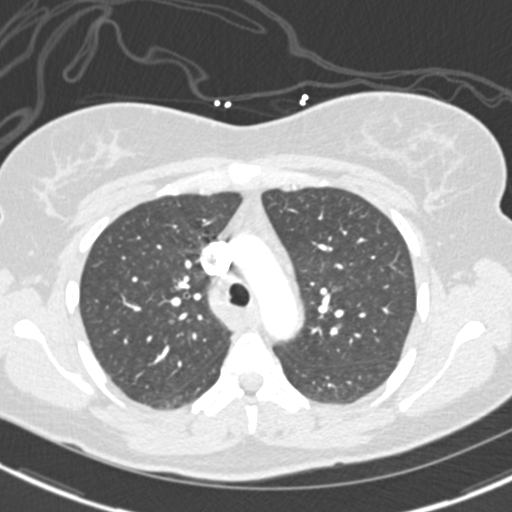
[im 196/266  soft-tissue]
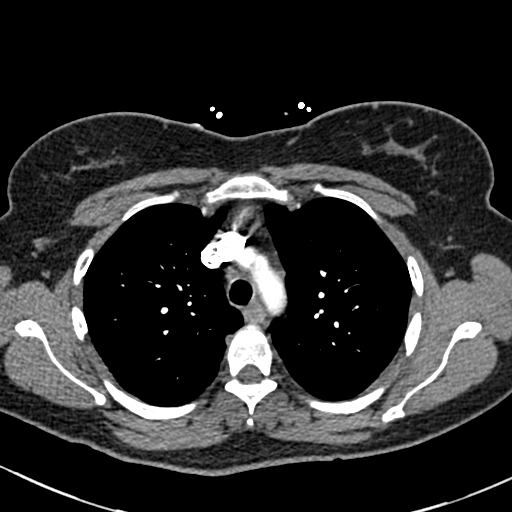
[im 219/266  lung]
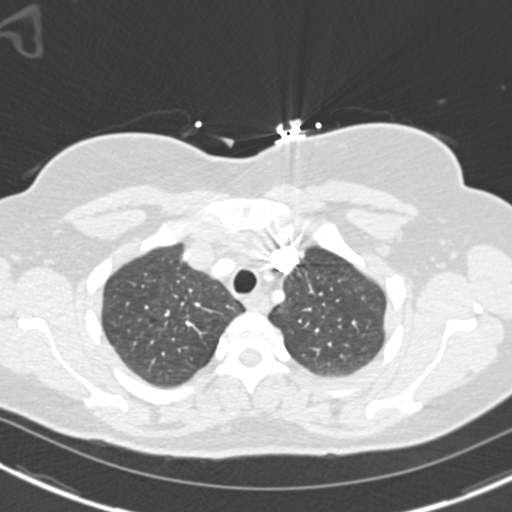
[im 231/266  soft-tissue]
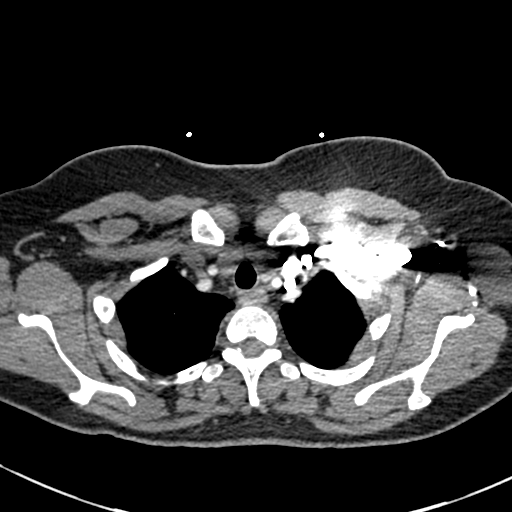
[im 254/266  lung]
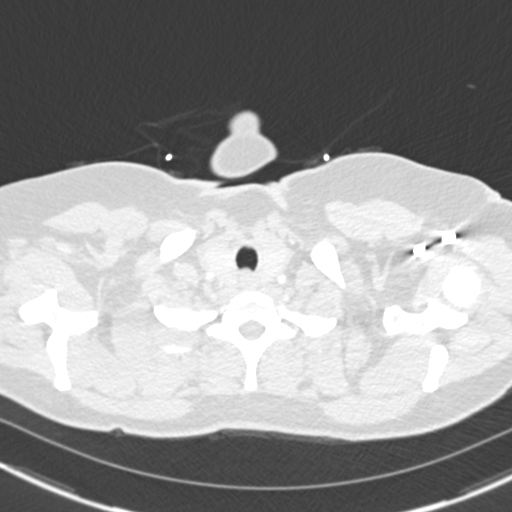

[Series 7: coronal mpr · coronal · 0.52mm/px · 3 of 88 slices shown]
[im 22/88  soft-tissue]
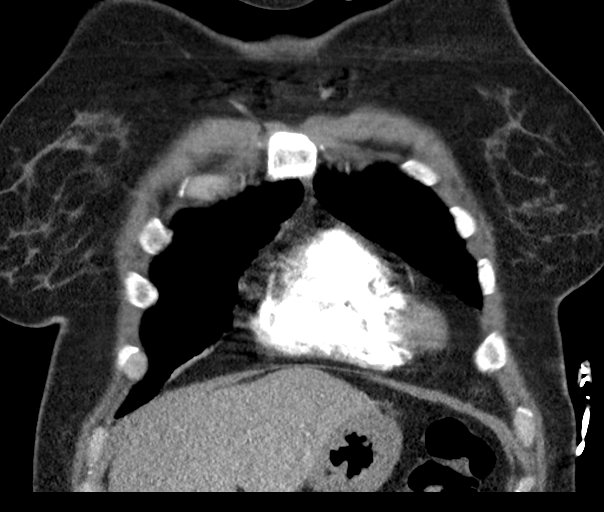
[im 44/88  soft-tissue]
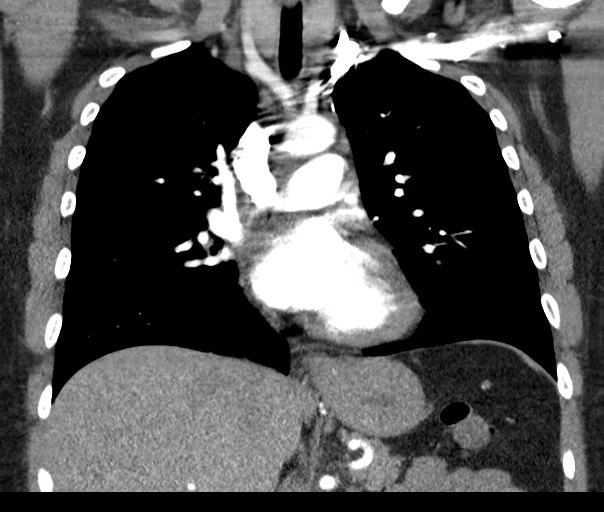
[im 66/88  soft-tissue]
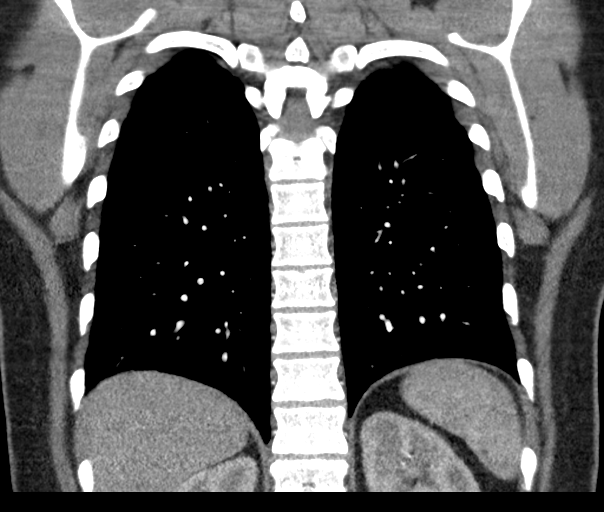

[18 of 46 positions shown; findings below may reference images not displayed]

FINDINGS: Cardiovascular: There is adequate opacification of the pulmonary
arterial tree. No intraluminal filling defect identified to suggest
acute pulmonary embolism. The central pulmonary arteries are of
normal caliber. Global cardiac size is within normal limits. No
significant coronary artery calcification. No pericardial effusion.
The thoracic aorta is unremarkable.

Mediastinum/Nodes: The visualized thyroid is unremarkable. No
pathologic thoracic adenopathy. Residual thymic tissue seen within
the anterior mediastinum. The esophagus is unremarkable. Small
hiatal hernia is present.

Lungs/Pleura: Mild biapical paraseptal emphysema. A 13 mm nodule is
seen within the superior segment of the right lower lobe
demonstrating central coarse calcifications possibly representing a
pulmonary hamartoma, though this is technically indeterminate.
Additional 3-4 mm noncalcified indeterminate pulmonary nodules are
seen within the right middle lobe anteriorly with their clustered
configuration suggesting the sequela of a remote inflammation or
infection. No pneumothorax or pleural effusion. The central airways
are widely patent. Mild bibasilar dependent atelectasis.

Upper Abdomen: Limited images of the upper abdomen are unremarkable.

Musculoskeletal: No acute bone abnormality identified.

Review of the MIP images confirms the above findings.
IMPRESSION: No pulmonary embolism.

13 mm indeterminate pulmonary nodule within the right lower lobe
possibly representing a pulmonary hamartoma. This is, however,
technically indeterminate. Standard surveillance guidelines have not
been established in a patient of this age, however, in absence of
prior examinations to establish chronicity of this lesion, a
follow-up examination could be obtained in 3-6 months and
subsequently in 18-24 months to document stability of the lesion.
Alternatively, PET CT could be performed to assess metabolic
activity of the lesion.

Mild paraseptal emphysema.

Small hiatal hernia

Emphysema ([id number]).

## 2022-09-20 ENCOUNTER — Other Ambulatory Visit: Payer: Self-pay

## 2022-09-20 MED ORDER — SAXENDA 18 MG/3ML ~~LOC~~ SOPN: 1.8000 mg | PEN_INJECTOR | Freq: Every day | SUBCUTANEOUS | 1 refills | Status: DC

## 2022-10-04 ENCOUNTER — Other Ambulatory Visit: Payer: Self-pay | Admitting: Internal Medicine

## 2022-10-04 DIAGNOSIS — G5603 Carpal tunnel syndrome, bilateral upper limbs: Secondary | ICD-10-CM

## 2022-11-01 ENCOUNTER — Other Ambulatory Visit: Payer: Self-pay

## 2022-11-01 ENCOUNTER — Other Ambulatory Visit: Payer: Self-pay | Admitting: Internal Medicine

## 2022-11-01 DIAGNOSIS — G5603 Carpal tunnel syndrome, bilateral upper limbs: Secondary | ICD-10-CM

## 2022-11-01 MED ORDER — SAXENDA 18 MG/3ML ~~LOC~~ SOPN: 1.8000 mg | PEN_INJECTOR | Freq: Every day | SUBCUTANEOUS | 1 refills | Status: DC

## 2022-11-06 ENCOUNTER — Ambulatory Visit: Payer: BC Managed Care – PPO | Admitting: Internal Medicine

## 2022-11-06 ENCOUNTER — Encounter: Payer: Self-pay | Admitting: Internal Medicine

## 2022-11-06 VITALS — BP 121/63 | HR 85 | Ht 64.0 in | Wt 174.0 lb

## 2022-11-06 DIAGNOSIS — G5603 Carpal tunnel syndrome, bilateral upper limbs: Secondary | ICD-10-CM | POA: Diagnosis not present

## 2022-11-06 DIAGNOSIS — F17200 Nicotine dependence, unspecified, uncomplicated: Secondary | ICD-10-CM

## 2022-11-06 DIAGNOSIS — E669 Obesity, unspecified: Secondary | ICD-10-CM

## 2022-11-06 MED ORDER — ZEPBOUND 5 MG/0.5ML ~~LOC~~ SOAJ
5.0000 mg | SUBCUTANEOUS | 1 refills | Status: DC
Start: 2022-11-06 — End: 2022-11-30

## 2022-11-06 NOTE — Progress Notes (Signed)
Established Patient Office Visit  Subjective:  Patient ID: Gabrielle Harmon, female    DOB: 1986-01-15  Age: 37 y.o. MRN: [id number]  Chief Complaint  Patient presents with   Follow-up    No new complaints, here for medication refills. Gained weight as [facility] is perpetually out of stock.   No other concerns at this time.   Past Medical History:  Diagnosis Date   Depression     Past Surgical History:  Procedure Laterality Date   adenocetomy     ESOPHAGOGASTRODUODENOSCOPY ENDOSCOPY     TUBAL LIGATION      Social History   Socioeconomic History   Marital status: Married    Spouse name: Not on file   Number of children: 3   Years of education: Not on file   Highest education level: Not on file  Occupational History   Occupation: warehouse  Tobacco Use   Smoking status: Every Day    Current packs/day: 1.00    Average packs/day: 1 pack/day for 20.0 years (20.0 ttl pk-yrs)    Types: Cigarettes   Smokeless tobacco: Never  Vaping Use   Vaping status: Never Used  Substance and Sexual Activity   Alcohol use: No   Drug use: No   Sexual activity: Yes    Birth control/protection: Surgical  Other Topics Concern   Not on file  Social History Narrative   Not on file   Social Determinants of Health   Financial Resource Strain: Not on file  Food Insecurity: Not on file  Transportation Needs: Not on file  Physical Activity: Not on file  Stress: Not on file  Social Connections: Not on file  Intimate Partner Violence: Not on file    Family History  Problem Relation Age of Onset   Diabetes Paternal Aunt     No Known Allergies  Review of Systems  Constitutional:  Negative for weight loss (gained 6 lbs).       Objective:   BP 121/63   Pulse 85   Ht 5\' 4"  (1.626 m)   Wt 174 lb (78.9 kg)   SpO2 98%   BMI 29.87 kg/m   Vitals:   11/06/22 1457  BP: 121/63  Pulse: 85  Height: 5\' 4"  (1.626 m)  Weight: 174 lb (78.9 kg)  SpO2: 98%  BMI (Calculated): 29.85     Physical Exam Vitals reviewed.  Constitutional:      General: She is not in acute distress.    Appearance: She is overweight.  HENT:     Head: Normocephalic.     Nose: Nose normal.     Mouth/Throat:     Mouth: Mucous membranes are moist.  Eyes:     Extraocular Movements: Extraocular movements intact.     Pupils: Pupils are equal, round, and reactive to light.  Cardiovascular:     Rate and Rhythm: Normal rate and regular rhythm.     Heart sounds: No murmur heard. Pulmonary:     Effort: Pulmonary effort is normal.     Breath sounds: No rhonchi or rales.  Abdominal:     General: Abdomen is flat.     Palpations: There is no hepatomegaly, splenomegaly or mass.  Musculoskeletal:        General: Normal range of motion.     Cervical back: Normal range of motion. No tenderness.  Skin:    General: Skin is warm and dry.  Neurological:     General: No focal deficit present.     Mental  Status: She is alert and oriented to person, place, and time.     Cranial Nerves: No cranial nerve deficit.     Motor: No weakness.  Psychiatric:        Mood and Affect: Mood normal.        Behavior: Behavior normal.      No results found for any visits on 11/06/22.  No results found for this or any previous visit (from the past 2160 hour(Laron Angelini)).    Assessment & Plan:   Problem List Items Addressed This Visit       Nervous and Auditory   Bilateral carpal tunnel syndrome   Relevant Medications   tirzepatide (ZEPBOUND) 5 MG/0.5ML Pen     Other   Tobacco dependence (Chronic)   Obesity (BMI 30-39.9) - Primary   Relevant Medications   tirzepatide (ZEPBOUND) 5 MG/0.5ML Pen    No follow-ups on file.   Total time spent: 20 minutes  Luna Fuse, MD  11/06/2022   This document may have been prepared by [facility] Voice Recognition software and as such may include unintentional dictation errors.

## 2022-11-29 ENCOUNTER — Other Ambulatory Visit: Payer: Self-pay | Admitting: Internal Medicine

## 2022-11-29 DIAGNOSIS — E669 Obesity, unspecified: Secondary | ICD-10-CM

## 2022-11-30 ENCOUNTER — Other Ambulatory Visit: Payer: Self-pay | Admitting: Internal Medicine

## 2022-11-30 DIAGNOSIS — G5603 Carpal tunnel syndrome, bilateral upper limbs: Secondary | ICD-10-CM

## 2022-12-31 ENCOUNTER — Other Ambulatory Visit: Payer: Self-pay | Admitting: Internal Medicine

## 2022-12-31 DIAGNOSIS — G5603 Carpal tunnel syndrome, bilateral upper limbs: Secondary | ICD-10-CM

## 2023-01-11 ENCOUNTER — Ambulatory Visit: Payer: BC Managed Care – PPO | Admitting: Internal Medicine

## 2023-01-11 ENCOUNTER — Encounter: Payer: Self-pay | Admitting: Internal Medicine

## 2023-01-11 VITALS — BP 118/72 | Ht 64.0 in | Wt 158.0 lb

## 2023-01-11 DIAGNOSIS — Z23 Encounter for immunization: Secondary | ICD-10-CM

## 2023-01-11 DIAGNOSIS — G5603 Carpal tunnel syndrome, bilateral upper limbs: Secondary | ICD-10-CM

## 2023-01-11 DIAGNOSIS — E669 Obesity, unspecified: Secondary | ICD-10-CM | POA: Diagnosis not present

## 2023-01-11 DIAGNOSIS — M25562 Pain in left knee: Secondary | ICD-10-CM

## 2023-01-11 DIAGNOSIS — Z0130 Encounter for examination of blood pressure without abnormal findings: Secondary | ICD-10-CM

## 2023-01-11 DIAGNOSIS — M25561 Pain in right knee: Secondary | ICD-10-CM

## 2023-01-11 MED ORDER — MELOXICAM 15 MG PO TABS: 15.0000 mg | ORAL_TABLET | Freq: Every day | ORAL | 0 refills | Status: AC

## 2023-01-11 MED ORDER — ZEPBOUND 7.5 MG/0.5ML ~~LOC~~ SOAJ
7.5000 mg | SUBCUTANEOUS | 1 refills | Status: DC
Start: 2023-01-11 — End: 2023-02-11

## 2023-01-11 NOTE — Progress Notes (Signed)
Established Patient Office Visit  Subjective:  Patient ID: Gabrielle Harmon, female    DOB: 08-Aug-1985  Age: 37 y.o. MRN: [id number]  Chief Complaint  Patient presents with   Follow-up    2 month follow up    Here for weight review and medication refills. Lost 16 lbs since last visit with diet and exercise. C/o bilateral knee pain worse at night not relieved by tramadol. Spends her entire work day on her feet at pain starts toward the end of the business day.   No other concerns at this time.   Past Medical History:  Diagnosis Date   Depression     Past Surgical History:  Procedure Laterality Date   adenocetomy     ESOPHAGOGASTRODUODENOSCOPY ENDOSCOPY     TUBAL LIGATION      Social History   Socioeconomic History   Marital status: Married    Spouse name: Not on file   Number of children: 3   Years of education: Not on file   Highest education level: Not on file  Occupational History   Occupation: warehouse  Tobacco Use   Smoking status: Every Day    Current packs/day: 1.00    Average packs/day: 1 pack/day for 20.0 years (20.0 ttl pk-yrs)    Types: Cigarettes   Smokeless tobacco: Never  Vaping Use   Vaping status: Never Used  Substance and Sexual Activity   Alcohol use: No   Drug use: No   Sexual activity: Yes    Birth control/protection: Surgical  Other Topics Concern   Not on file  Social History Narrative   Not on file   Social Determinants of Health   Financial Resource Strain: Not on file  Food Insecurity: Not on file  Transportation Needs: Not on file  Physical Activity: Not on file  Stress: Not on file  Social Connections: Not on file  Intimate Partner Violence: Not on file    Family History  Problem Relation Age of Onset   Diabetes Paternal Aunt     No Known Allergies  Review of Systems  Constitutional:  Positive for weight loss (16 lbs).  Musculoskeletal:  Positive for joint pain (bilateral knees).       Objective:   BP 118/72    Ht 5\' 4"  (1.626 m)   Wt 158 lb (71.7 kg)   BMI 27.12 kg/m   Vitals:   01/11/23 1443  BP: 118/72  Height: 5\' 4"  (1.626 m)  Weight: 158 lb (71.7 kg)  BMI (Calculated): 27.11    Physical Exam Vitals reviewed.  Constitutional:      General: She is not in acute distress.    Appearance: She is overweight.  HENT:     Head: Normocephalic.     Nose: Nose normal.     Mouth/Throat:     Mouth: Mucous membranes are moist.  Eyes:     Extraocular Movements: Extraocular movements intact.     Pupils: Pupils are equal, round, and reactive to light.  Cardiovascular:     Rate and Rhythm: Normal rate and regular rhythm.     Heart sounds: No murmur heard. Pulmonary:     Effort: Pulmonary effort is normal.     Breath sounds: No rhonchi or rales.  Abdominal:     General: Abdomen is flat.     Palpations: There is no hepatomegaly, splenomegaly or mass.  Musculoskeletal:        General: Normal range of motion.     Cervical back: Normal range  of motion. No tenderness.     Right knee: No swelling. No tenderness. No MCL or patellar tendon tenderness.     Left knee: Tenderness present. No MCL or patellar tendon tenderness.     Comments: No crepitus  Skin:    General: Skin is warm and dry.  Neurological:     General: No focal deficit present.     Mental Status: She is alert and oriented to person, place, and time.     Cranial Nerves: No cranial nerve deficit.     Motor: No weakness.  Psychiatric:        Mood and Affect: Mood normal.        Behavior: Behavior normal.      No results found for any visits on 01/11/23.  No results found for this or any previous visit (from the past 2160 hour(Rotunda Worden)).    Assessment & Plan:  As per problem list. Order PT if xr'Jaydy Fitzhenry are negative. Problem List Items Addressed This Visit       Nervous and Auditory   Bilateral carpal tunnel syndrome   Relevant Medications   tirzepatide (ZEPBOUND) 7.5 MG/0.5ML Pen     Other   Obesity (BMI 30-39.9) - Primary    Relevant Medications   tirzepatide (ZEPBOUND) 7.5 MG/0.5ML Pen   Other Visit Diagnoses     Arthralgia of both knees       Relevant Medications   meloxicam (MOBIC) 15 MG tablet   Other Relevant Orders   DG Knee Complete 4 Views Left   DG Knee Complete 4 Views Right       Return in about 2 months (around 03/13/2023) for Weight management.   Total time spent: 30 minutes  Luna Fuse, MD  01/11/2023   This document may have been prepared by [facility] Voice Recognition software and as such may include unintentional dictation errors.

## 2023-01-21 NOTE — Addendum Note (Signed)
Addended by: Matthew Saras on: 01/21/2023 03:22 PM   Modules accepted: Orders

## 2023-01-22 ENCOUNTER — Other Ambulatory Visit: Payer: BC Managed Care – PPO

## 2023-01-28 ENCOUNTER — Other Ambulatory Visit: Payer: Self-pay | Admitting: Internal Medicine

## 2023-01-28 DIAGNOSIS — G5603 Carpal tunnel syndrome, bilateral upper limbs: Secondary | ICD-10-CM

## 2023-01-30 ENCOUNTER — Other Ambulatory Visit (HOSPITAL_COMMUNITY)
Admission: RE | Admit: 2023-01-30 | Discharge: 2023-01-30 | Disposition: A | Discharge status: Home / Self Care | Payer: BC Managed Care – PPO | Source: Ambulatory Visit | Attending: Family Medicine | Admitting: Family Medicine

## 2023-01-30 ENCOUNTER — Ambulatory Visit: Payer: BC Managed Care – PPO | Admitting: Obstetrics and Gynecology

## 2023-01-30 ENCOUNTER — Encounter: Payer: Self-pay | Admitting: Obstetrics and Gynecology

## 2023-01-30 ENCOUNTER — Other Ambulatory Visit: Payer: Self-pay

## 2023-01-30 VITALS — BP 100/66 | HR 77 | Ht 64.0 in | Wt 154.7 lb

## 2023-01-30 DIAGNOSIS — Z124 Encounter for screening for malignant neoplasm of cervix: Secondary | ICD-10-CM | POA: Diagnosis present

## 2023-01-30 DIAGNOSIS — A5901 Trichomonal vulvovaginitis: Secondary | ICD-10-CM | POA: Diagnosis not present

## 2023-01-31 NOTE — Progress Notes (Signed)
Obstetrics and Gynecology New Patient Evaluation  Appointment Date: 01/30/2023  OBGYN Clinic: [facility] for [facility]   Primary Care Provider: Sherron Monday  Referring Provider: Self  Chief Complaint: pap smear  History of Present Illness: Gabrielle Harmon is a 37 y.o.  [phone] (Patient's last menstrual period was 01/20/2023 (approximate).), seen for the above chief complaint.   Patient overdue for pap smear. No problems or issues today.   Review of Systems: Pertinent items are noted in HPI.   Past Medical History:  Past Medical History:  Diagnosis Date   Depression     Past Surgical History:  Past Surgical History:  Procedure Laterality Date   adenocetomy     ESOPHAGOGASTRODUODENOSCOPY ENDOSCOPY     TUBAL LIGATION      Past Obstetrical History:  OB History  Gravida Para Term Preterm AB Living  6 3 3   2 3   SAB IAB Ectopic Multiple Live Births  1 1     3     # Outcome Date GA Lbr Len/2nd Weight Sex Type Anes PTL Lv  6 Gravida           5 Term 04/07/09   6 lb 10 oz (3.005 kg) F Vag-Spont   LIV  4 Term 03/20/06   8 lb 1 oz (3.657 kg) F Vag-Spont   LIV  3 Term 02/06/04   7 lb 6 oz (3.345 kg) F Vag-Spont   LIV  2 IAB           1 SAB             Past Gynecological History: As per HPI. Periods: qmonth, regular, approximately 1 week, not too heavy or painful.  History of Pap Smear(s): Yes.   Last pap 2021, which was negative pap and HPV She is currently using bilateral tubal ligation for contraception.   Social History:  Social History   Socioeconomic History   Marital status: Married    Spouse name: Not on file   Number of children: 3   Years of education: Not on file   Highest education level: Not on file  Occupational History   Occupation: warehouse  Tobacco Use   Smoking status: Every Day    Current packs/day: 1.00    Average packs/day: 1 pack/day for 20.0 years (20.0 ttl pk-yrs)    Types: Cigarettes   Smokeless tobacco:  Never  Vaping Use   Vaping status: Never Used  Substance and Sexual Activity   Alcohol use: No   Drug use: No   Sexual activity: Yes    Birth control/protection: Surgical  Other Topics Concern   Not on file  Social History Narrative   Not on file   Social Determinants of Health   Financial Resource Strain: Not on file  Food Insecurity: Not on file  Transportation Needs: Not on file  Physical Activity: Not on file  Stress: Not on file  Social Connections: Not on file  Intimate Partner Violence: Not on file    Family History:  Family History  Problem Relation Age of Onset   Diabetes Paternal Aunt     Medications [name] had no medications administered during this visit. Current Outpatient Medications  Medication Sig Dispense Refill   tirzepatide (ZEPBOUND) 7.5 MG/0.5ML Pen Inject 7.5 mg into the skin once a week. 2 mL 1   traMADol (ULTRAM) 50 MG tablet TAKE 1 TABLET(50 MG) BY MOUTH EVERY 6 HOURS AS NEEDED 120 tablet 0   meloxicam (  MOBIC) 15 MG tablet Take 1 tablet (15 mg total) by mouth daily. (Patient not taking: Reported on 01/30/2023) 30 tablet 0   pantoprazole (PROTONIX) 40 MG tablet Take 1 tablet (40 mg total) by mouth daily. (Patient not taking: Reported on 01/11/2023) 30 tablet 1   No current facility-administered medications for this visit.    Allergies Patient has no known allergies.   Physical Exam:  BP 100/66   Pulse 77   Ht 5\' 4"  (1.626 m)   Wt 154 lb 11.2 oz (70.2 kg)   LMP 01/20/2023 (Approximate)   BMI 26.55 kg/m  Body mass index is 26.55 kg/m. General appearance: Well nourished, well developed female in no acute distress.  Cardiovascular: normal s1 and s2.  No murmurs, rubs or gallops. Respiratory:  Clear to auscultation bilateral. Normal respiratory effort Abdomen: positive bowel sounds and no masses, hernias; diffusely non tender to palpation, non distended Neuro/Psych:  Normal mood and affect.  Skin:  Warm and dry.  Lymphatic:  No  inguinal lymphadenopathy.   Cervical exam performed in the presence of a chaperone Pelvic exam: is not limited by body habitus EGBUS: within normal limits Vagina: within normal limits and with no blood or discharge in the vault Cervix: normal appearing cervix without tenderness, discharge or lesions. Uterus:  nonenlarged and non tender Adnexa:  normal adnexa and no mass, fullness, tenderness Rectovaginal: deferred  Laboratory: none  Radiology: none  Assessment: patient doing well  Plan:  1. Cervical cancer screening Declines STD blood testing - Cytology - PAP( [place])  RTC PRN  Future Appointments  Date Time Provider Department Center  02/05/2023  9:00 AM AMA DG/CT AMA-DG None  02/05/2023  9:15 AM AMA DG/CT AMA-DG None  03/15/2023  2:30 PM Sherron Monday, MD AMA-AMA None   Cornelia Copa MD Attending [facility])

## 2023-02-05 ENCOUNTER — Ambulatory Visit: Payer: BC Managed Care – PPO

## 2023-02-05 ENCOUNTER — Ambulatory Visit (INDEPENDENT_AMBULATORY_CARE_PROVIDER_SITE_OTHER): Payer: BC Managed Care – PPO

## 2023-02-05 DIAGNOSIS — M25561 Pain in right knee: Secondary | ICD-10-CM

## 2023-02-05 DIAGNOSIS — M25562 Pain in left knee: Secondary | ICD-10-CM | POA: Diagnosis not present

## 2023-02-05 LAB — CYTOLOGY - PAP
Chlamydia: NEGATIVE
Comment: NEGATIVE
Comment: NEGATIVE
Comment: NEGATIVE
Comment: NORMAL
Diagnosis: NEGATIVE
High risk HPV: NEGATIVE
Neisseria Gonorrhea: NEGATIVE
Trichomonas: POSITIVE — AB

## 2023-02-08 ENCOUNTER — Encounter: Payer: Self-pay | Admitting: Obstetrics and Gynecology

## 2023-02-08 DIAGNOSIS — A599 Trichomoniasis, unspecified: Secondary | ICD-10-CM | POA: Insufficient documentation

## 2023-02-08 MED ORDER — METRONIDAZOLE 500 MG PO TABS: 500.0000 mg | ORAL_TABLET | Freq: Two times a day (BID) | ORAL | 0 refills | Status: AC

## 2023-02-08 NOTE — Addendum Note (Signed)
Addended by: [place] Bing on: 02/08/2023 11:26 AM   Modules accepted: Orders

## 2023-02-09 ENCOUNTER — Other Ambulatory Visit: Payer: Self-pay | Admitting: Internal Medicine

## 2023-02-09 DIAGNOSIS — E669 Obesity, unspecified: Secondary | ICD-10-CM

## 2023-02-12 NOTE — Progress Notes (Signed)
Patient notified

## 2023-02-25 ENCOUNTER — Other Ambulatory Visit: Payer: Self-pay | Admitting: Internal Medicine

## 2023-02-25 DIAGNOSIS — G5603 Carpal tunnel syndrome, bilateral upper limbs: Secondary | ICD-10-CM

## 2023-03-15 ENCOUNTER — Encounter: Payer: Self-pay | Admitting: Internal Medicine

## 2023-03-15 ENCOUNTER — Ambulatory Visit: Payer: BC Managed Care – PPO | Admitting: Internal Medicine

## 2023-03-15 VITALS — BP 120/62 | HR 102 | Ht 64.0 in | Wt 147.0 lb

## 2023-03-15 DIAGNOSIS — M25561 Pain in right knee: Secondary | ICD-10-CM | POA: Diagnosis not present

## 2023-03-15 DIAGNOSIS — F17200 Nicotine dependence, unspecified, uncomplicated: Secondary | ICD-10-CM

## 2023-03-15 DIAGNOSIS — E669 Obesity, unspecified: Secondary | ICD-10-CM | POA: Diagnosis not present

## 2023-03-15 DIAGNOSIS — M25562 Pain in left knee: Secondary | ICD-10-CM | POA: Diagnosis not present

## 2023-03-15 MED ORDER — ZEPBOUND 10 MG/0.5ML ~~LOC~~ SOAJ: 10.0000 mg | SUBCUTANEOUS | 0 refills | Status: DC

## 2023-03-15 NOTE — Progress Notes (Signed)
 Established Patient Office Visit  Subjective:  Patient ID: TEOSHA CASSO, female    DOB: Nov 25, 1985  Age: 38 y.o. MRN: [id number]  Chief Complaint  Patient presents with   Follow-up    2 Month Weight Management    No new complaints, continues to lose weight with diet and exercise on GLP-1.    No other concerns at this time.   Past Medical History:  Diagnosis Date   Depression     Past Surgical History:  Procedure Laterality Date   adenocetomy     ESOPHAGOGASTRODUODENOSCOPY ENDOSCOPY     TUBAL LIGATION      Social History   Socioeconomic History   Marital status: Married    Spouse name: Not on file   Number of children: 3   Years of education: Not on file   Highest education level: Not on file  Occupational History   Occupation: warehouse  Tobacco Use   Smoking status: Every Day    Current packs/day: 1.00    Average packs/day: 1 pack/day for 20.0 years (20.0 ttl pk-yrs)    Types: Cigarettes   Smokeless tobacco: Never  Vaping Use   Vaping status: Never Used  Substance and Sexual Activity   Alcohol use: No   Drug use: No   Sexual activity: Yes    Birth control/protection: Surgical  Other Topics Concern   Not on file  Social History Narrative   Not on file   Social Drivers of Health   Financial Resource Strain: Not on file  Food Insecurity: Not on file  Transportation Needs: Not on file  Physical Activity: Not on file  Stress: Not on file  Social Connections: Not on file  Intimate Partner Violence: Not on file    Family History  Problem Relation Age of Onset   Diabetes Paternal Aunt     No Known Allergies  Outpatient Medications Prior to Visit  Medication Sig   pantoprazole  (PROTONIX ) 40 MG tablet Take 1 tablet (40 mg total) by mouth daily. (Patient not taking: Reported on 01/11/2023)   traMADol  (ULTRAM ) 50 MG tablet TAKE 1 TABLET(50 MG) BY MOUTH EVERY 6 HOURS AS NEEDED   [DISCONTINUED] ZEPBOUND  7.5 MG/0.5ML Pen ADMINISTER 7.5 MG UNDER THE  SKIN 1 TIME A WEEK   No facility-administered medications prior to visit.    Review of Systems  Constitutional:  Positive for weight loss (7 lbs).  HENT: Negative.    Eyes: Negative.   Respiratory: Negative.    Cardiovascular:  Negative for chest pain, palpitations and orthopnea.  Gastrointestinal: Negative.   Genitourinary: Negative.   Skin: Negative.   Neurological: Negative.   Endo/Heme/Allergies: Negative.        Objective:   BP 120/62   Pulse (!) 102   Ht 5' 4 (1.626 m)   Wt 147 lb (66.7 kg)   SpO2 98%   BMI 25.23 kg/m   Vitals:   03/15/23 1419  BP: 120/62  Pulse: (!) 102  Height: 5' 4 (1.626 m)  Weight: 147 lb (66.7 kg)  SpO2: 98%  BMI (Calculated): 25.22    Physical Exam Vitals reviewed.  Constitutional:      General: She is not in acute distress.    Appearance: She is overweight.  HENT:     Head: Normocephalic.     Nose: Nose normal.     Mouth/Throat:     Mouth: Mucous membranes are moist.  Eyes:     Extraocular Movements: Extraocular movements intact.     Pupils:  Pupils are equal, round, and reactive to light.  Cardiovascular:     Rate and Rhythm: Normal rate and regular rhythm.     Heart sounds: No murmur heard. Pulmonary:     Effort: Pulmonary effort is normal.     Breath sounds: No rhonchi or rales.  Abdominal:     General: Abdomen is flat.     Palpations: There is no hepatomegaly, splenomegaly or mass.  Musculoskeletal:        General: Normal range of motion.     Cervical back: Normal range of motion. No tenderness.     Right knee: No swelling. No tenderness. No MCL or patellar tendon tenderness.     Left knee: Tenderness present. No MCL or patellar tendon tenderness.     Comments: No crepitus  Skin:    General: Skin is warm and dry.  Neurological:     General: No focal deficit present.     Mental Status: She is alert and oriented to person, place, and time.     Cranial Nerves: No cranial nerve deficit.     Motor: No weakness.   Psychiatric:        Mood and Affect: Mood normal.        Behavior: Behavior normal.      No results found for any visits on 03/15/23.      Assessment & Plan:  As per problem list. Target weight desired by patient is around 140 lbs.  Problem List Items Addressed This Visit       Other   Tobacco dependence (Chronic)   Obesity (BMI 30-39.9) - Primary   Relevant Medications   tirzepatide  (ZEPBOUND ) 10 MG/0.5ML Pen   Other Visit Diagnoses       Arthralgia of both knees           Return in about 2 months (around 05/13/2023) for Weight management.   Total time spent: 20 minutes  Sherrill Cinderella Perry, MD  03/15/2023   This document may have been prepared by [facility] Voice Recognition software and as such may include unintentional dictation errors.

## 2023-03-25 ENCOUNTER — Other Ambulatory Visit: Payer: Self-pay | Admitting: Internal Medicine

## 2023-03-25 DIAGNOSIS — G5603 Carpal tunnel syndrome, bilateral upper limbs: Secondary | ICD-10-CM

## 2023-04-20 ENCOUNTER — Encounter: Payer: Self-pay | Admitting: Internal Medicine

## 2023-04-23 ENCOUNTER — Other Ambulatory Visit: Payer: Self-pay | Admitting: Internal Medicine

## 2023-04-23 DIAGNOSIS — G5603 Carpal tunnel syndrome, bilateral upper limbs: Secondary | ICD-10-CM

## 2023-05-17 ENCOUNTER — Ambulatory Visit: Payer: BC Managed Care – PPO | Admitting: Internal Medicine

## 2023-05-17 VITALS — BP 131/78 | HR 85 | Temp 97.4°F | Ht 64.0 in | Wt 139.0 lb

## 2023-05-17 DIAGNOSIS — F418 Other specified anxiety disorders: Secondary | ICD-10-CM

## 2023-05-17 DIAGNOSIS — G5603 Carpal tunnel syndrome, bilateral upper limbs: Secondary | ICD-10-CM | POA: Diagnosis not present

## 2023-05-17 DIAGNOSIS — Z0130 Encounter for examination of blood pressure without abnormal findings: Secondary | ICD-10-CM

## 2023-05-17 MED ORDER — TRAMADOL HCL 50 MG PO TABS: 50.0000 mg | ORAL_TABLET | Freq: Four times a day (QID) | ORAL | 0 refills | Status: DC | PRN

## 2023-05-17 MED ORDER — TRAMADOL HCL 50 MG PO TABS: ORAL_TABLET | ORAL | 0 refills | Status: AC

## 2023-05-17 NOTE — Progress Notes (Signed)
 Established Patient Office Visit  Subjective:  Patient ID: Gabrielle Harmon, female    DOB: 11-26-1985  Age: 38 y.o. MRN: [id number]  Chief Complaint  Patient presents with   Follow-up    2 month weight management     C/o situational anxiety and also here for medication refills. Continues to lose weight on Zepbound and exercise.    No other concerns at this time.   Past Medical History:  Diagnosis Date   Depression     Past Surgical History:  Procedure Laterality Date   adenocetomy     ESOPHAGOGASTRODUODENOSCOPY ENDOSCOPY     TUBAL LIGATION      Social History   Socioeconomic History   Marital status: Married    Spouse name: Not on file   Number of children: 3   Years of education: Not on file   Highest education level: Not on file  Occupational History   Occupation: warehouse  Tobacco Use   Smoking status: Every Day    Current packs/day: 1.00    Average packs/day: 1 pack/day for 20.0 years (20.0 ttl pk-yrs)    Types: Cigarettes   Smokeless tobacco: Never  Vaping Use   Vaping status: Never Used  Substance and Sexual Activity   Alcohol use: No   Drug use: No   Sexual activity: Yes    Birth control/protection: Surgical  Other Topics Concern   Not on file  Social History Narrative   Not on file   Social Drivers of Health   Financial Resource Strain: Not on file  Food Insecurity: Not on file  Transportation Needs: Not on file  Physical Activity: Not on file  Stress: Not on file  Social Connections: Not on file  Intimate Partner Violence: Not on file    Family History  Problem Relation Age of Onset   Diabetes Paternal Aunt     No Known Allergies  Outpatient Medications Prior to Visit  Medication Sig   pantoprazole (PROTONIX) 40 MG tablet Take 1 tablet (40 mg total) by mouth daily. (Patient not taking: Reported on 01/11/2023)   tirzepatide (ZEPBOUND) 10 MG/0.5ML Pen Inject 10 mg into the skin once a week.   [DISCONTINUED] traMADol (ULTRAM) 50 MG  tablet TAKE 1 TABLET(50 MG) BY MOUTH EVERY 6 HOURS AS NEEDED   No facility-administered medications prior to visit.    Review of Systems  Constitutional:  Positive for weight loss (8 lbs).  HENT: Negative.    Eyes: Negative.   Respiratory: Negative.    Cardiovascular:  Negative for chest pain, palpitations and orthopnea.  Gastrointestinal: Negative.   Genitourinary: Negative.   Skin: Negative.   Neurological: Negative.   Endo/Heme/Allergies: Negative.   Psychiatric/Behavioral:  The patient is nervous/anxious.        Objective:   BP 131/78   Pulse 85   Temp (!) 97.4 F (36.3 C)   Ht 5\' 4"  (1.626 m)   Wt 139 lb (63 kg)   BMI 23.86 kg/m   Vitals:   05/17/23 1103  BP: 131/78  Pulse: 85  Temp: (!) 97.4 F (36.3 C)  Height: 5\' 4"  (1.626 m)  Weight: 139 lb (63 kg)  BMI (Calculated): 23.85    Physical Exam Vitals reviewed.  Constitutional:      General: She is not in acute distress.    Appearance: She is overweight.  HENT:     Head: Normocephalic.     Nose: Nose normal.     Mouth/Throat:     Mouth: Mucous  membranes are moist.  Eyes:     Extraocular Movements: Extraocular movements intact.     Pupils: Pupils are equal, round, and reactive to light.  Cardiovascular:     Rate and Rhythm: Normal rate and regular rhythm.     Heart sounds: No murmur heard. Pulmonary:     Effort: Pulmonary effort is normal.     Breath sounds: No rhonchi or rales.  Abdominal:     General: Abdomen is flat.     Palpations: There is no hepatomegaly, splenomegaly or mass.  Musculoskeletal:        General: Normal range of motion.     Cervical back: Normal range of motion. No tenderness.     Right knee: No swelling. No tenderness. No MCL or patellar tendon tenderness.     Left knee: Tenderness present. No MCL or patellar tendon tenderness.     Comments: No crepitus  Skin:    General: Skin is warm and dry.  Neurological:     General: No focal deficit present.     Mental Status: She  is alert and oriented to person, place, and time.     Cranial Nerves: No cranial nerve deficit.     Motor: No weakness.  Psychiatric:        Mood and Affect: Mood normal.        Behavior: Behavior normal.      No results found for any visits on 05/17/23.  No results found for this or any previous visit (from the past 2160 hours).    Assessment & Plan:  As per problem list  Problem List Items Addressed This Visit       Nervous and Auditory   Bilateral carpal tunnel syndrome   Relevant Medications   traMADol (ULTRAM) 50 MG tablet (Start on 05/24/2023)   traMADol (ULTRAM) 50 MG tablet (Start on 06/24/2023)   Other Visit Diagnoses       Situational anxiety    -  Primary   Relevant Orders   Ambulatory referral to Psychology       Return in about 2 months (around 07/17/2023).   Total time spent: 30 minutes  Luna Fuse, MD  05/17/2023   This document may have been prepared by [facility] Voice Recognition software and as such may include unintentional dictation errors.

## 2023-05-21 ENCOUNTER — Telehealth: Payer: Self-pay

## 2023-05-21 ENCOUNTER — Other Ambulatory Visit: Payer: Self-pay | Admitting: Internal Medicine

## 2023-05-21 DIAGNOSIS — G5603 Carpal tunnel syndrome, bilateral upper limbs: Secondary | ICD-10-CM

## 2023-05-21 MED ORDER — TRAMADOL HCL 50 MG PO TABS: 50.0000 mg | ORAL_TABLET | Freq: Four times a day (QID) | ORAL | 0 refills | Status: DC | PRN

## 2023-05-21 NOTE — Telephone Encounter (Signed)
 RX correction for Tramadol- fix quantity to 120

## 2023-06-19 ENCOUNTER — Other Ambulatory Visit: Payer: Self-pay | Admitting: Internal Medicine

## 2023-06-19 DIAGNOSIS — E669 Obesity, unspecified: Secondary | ICD-10-CM

## 2023-07-19 ENCOUNTER — Ambulatory Visit: Admitting: Internal Medicine

## 2023-07-21 ENCOUNTER — Other Ambulatory Visit: Payer: Self-pay | Admitting: Internal Medicine

## 2023-07-21 DIAGNOSIS — G5603 Carpal tunnel syndrome, bilateral upper limbs: Secondary | ICD-10-CM

## 2023-07-22 MED ORDER — TRAMADOL HCL 50 MG PO TABS: 50.0000 mg | ORAL_TABLET | Freq: Four times a day (QID) | ORAL | 0 refills | Status: DC | PRN

## 2023-07-26 ENCOUNTER — Ambulatory Visit: Admitting: Internal Medicine

## 2023-07-26 DIAGNOSIS — E669 Obesity, unspecified: Secondary | ICD-10-CM

## 2023-07-26 DIAGNOSIS — Z0130 Encounter for examination of blood pressure without abnormal findings: Secondary | ICD-10-CM

## 2023-07-26 MED ORDER — ZEPBOUND 12.5 MG/0.5ML ~~LOC~~ SOAJ: 12.5000 mg | SUBCUTANEOUS | 2 refills | Status: DC

## 2023-07-26 NOTE — Progress Notes (Signed)
 Established Patient Office Visit  Subjective:  Patient ID: Gabrielle Harmon, female    DOB: 07/24/1985  Age: 38 y.o. MRN: [id number]  Chief Complaint  Patient presents with   Follow-up    2 month follow up    No new complaints, here for weight management and lost 5 lbs since last visit.     No other concerns at this time.   Past Medical History:  Diagnosis Date   Depression     Past Surgical History:  Procedure Laterality Date   adenocetomy     ESOPHAGOGASTRODUODENOSCOPY ENDOSCOPY     TUBAL LIGATION      Social History   Socioeconomic History   Marital status: Married    Spouse name: Not on file   Number of children: 3   Years of education: Not on file   Highest education level: Not on file  Occupational History   Occupation: warehouse  Tobacco Use   Smoking status: Every Day    Current packs/day: 1.00    Average packs/day: 1 pack/day for 20.0 years (20.0 ttl pk-yrs)    Types: Cigarettes   Smokeless tobacco: Never  Vaping Use   Vaping status: Never Used  Substance and Sexual Activity   Alcohol use: No   Drug use: No   Sexual activity: Yes    Birth control/protection: Surgical  Other Topics Concern   Not on file  Social History Narrative   Not on file   Social Drivers of Health   Financial Resource Strain: Not on file  Food Insecurity: Not on file  Transportation Needs: Not on file  Physical Activity: Not on file  Stress: Not on file  Social Connections: Not on file  Intimate Partner Violence: Not on file    Family History  Problem Relation Age of Onset   Diabetes Paternal Aunt     No Known Allergies  Outpatient Medications Prior to Visit  Medication Sig   traMADol  (ULTRAM ) 50 MG tablet Take 1 tablet (50 mg total) by mouth every 6 (six) hours as needed.   [DISCONTINUED] ZEPBOUND  10 MG/0.5ML Pen ADMINISTER 10 MG UNDER THE SKIN 1 TIME A WEEK   pantoprazole  (PROTONIX ) 40 MG tablet Take 1 tablet (40 mg total) by mouth daily. (Patient not  taking: Reported on 07/26/2023)   No facility-administered medications prior to visit.    Review of Systems  Constitutional:  Positive for weight loss (5 lbs).  HENT: Negative.    Eyes: Negative.   Respiratory: Negative.    Cardiovascular:  Negative for chest pain, palpitations and orthopnea.  Gastrointestinal: Negative.   Genitourinary: Negative.   Skin: Negative.   Neurological: Negative.   Endo/Heme/Allergies: Negative.   Psychiatric/Behavioral:  The patient is nervous/anxious.        Objective:   BP 122/72   Pulse 89   Temp 98 F (36.7 C)   Ht 5\' 3"  (1.6 m)   Wt 134 lb 6.4 oz (61 kg)   SpO2 98%   BMI 23.81 kg/m   Vitals:   07/26/23 1454  BP: 122/72  Pulse: 89  Temp: 98 F (36.7 C)  Height: 5\' 3"  (1.6 m)  Weight: 134 lb 6.4 oz (61 kg)  SpO2: 98%  BMI (Calculated): 23.81    Physical Exam Vitals reviewed.  Constitutional:      General: She is not in acute distress.    Appearance: She is overweight.  HENT:     Head: Normocephalic.     Nose: Nose normal.  Mouth/Throat:     Mouth: Mucous membranes are moist.  Eyes:     Extraocular Movements: Extraocular movements intact.     Pupils: Pupils are equal, round, and reactive to light.  Cardiovascular:     Rate and Rhythm: Normal rate and regular rhythm.     Heart sounds: No murmur heard. Pulmonary:     Effort: Pulmonary effort is normal.     Breath sounds: No rhonchi or rales.  Abdominal:     General: Abdomen is flat.     Palpations: There is no hepatomegaly, splenomegaly or mass.  Musculoskeletal:        General: Normal range of motion.     Cervical back: Normal range of motion. No tenderness.     Right knee: No swelling. No tenderness. No MCL or patellar tendon tenderness.     Left knee: Tenderness present. No MCL or patellar tendon tenderness.     Comments: No crepitus  Skin:    General: Skin is warm and dry.  Neurological:     General: No focal deficit present.     Mental Status: She is  alert and oriented to person, place, and time.     Cranial Nerves: No cranial nerve deficit.     Motor: No weakness.  Psychiatric:        Mood and Affect: Mood normal.        Behavior: Behavior normal.      No results found for any visits on 07/26/23.  No results found for this or any previous visit (from the past 2160 hours).    Assessment & Plan:  As per problem list.  Problem List Items Addressed This Visit       Other   Obesity (BMI 30-39.9)    Return in about 2 weeks (around 08/09/2023) for Weight management.   Total time spent: 20 minutes  Arzella Bitters, MD  07/26/2023   This document may have been prepared by [facility] Voice Recognition software and as such may include unintentional dictation errors.

## 2023-08-21 ENCOUNTER — Other Ambulatory Visit: Payer: Self-pay | Admitting: Internal Medicine

## 2023-08-21 DIAGNOSIS — G5603 Carpal tunnel syndrome, bilateral upper limbs: Secondary | ICD-10-CM

## 2023-08-23 ENCOUNTER — Encounter: Payer: Self-pay | Admitting: Internal Medicine

## 2023-09-02 ENCOUNTER — Encounter: Payer: Self-pay | Admitting: Internal Medicine

## 2023-09-03 ENCOUNTER — Other Ambulatory Visit: Payer: Self-pay | Admitting: Internal Medicine

## 2023-09-03 DIAGNOSIS — E669 Obesity, unspecified: Secondary | ICD-10-CM

## 2023-09-03 MED ORDER — ZEPBOUND 15 MG/0.5ML ~~LOC~~ SOAJ: 15.0000 mg | SUBCUTANEOUS | 0 refills | Status: DC

## 2023-09-18 ENCOUNTER — Other Ambulatory Visit: Payer: Self-pay | Admitting: Internal Medicine

## 2023-09-18 DIAGNOSIS — G5603 Carpal tunnel syndrome, bilateral upper limbs: Secondary | ICD-10-CM

## 2023-09-20 ENCOUNTER — Encounter: Payer: Self-pay | Admitting: Internal Medicine

## 2023-10-01 ENCOUNTER — Ambulatory Visit: Admitting: Internal Medicine

## 2023-10-04 ENCOUNTER — Ambulatory Visit: Admitting: Internal Medicine

## 2023-10-04 ENCOUNTER — Encounter: Payer: Self-pay | Admitting: Internal Medicine

## 2023-10-04 DIAGNOSIS — G5603 Carpal tunnel syndrome, bilateral upper limbs: Secondary | ICD-10-CM | POA: Diagnosis not present

## 2023-10-04 DIAGNOSIS — Z0130 Encounter for examination of blood pressure without abnormal findings: Secondary | ICD-10-CM

## 2023-10-04 MED ORDER — TRAMADOL HCL 50 MG PO TABS: 50.0000 mg | ORAL_TABLET | Freq: Four times a day (QID) | ORAL | 0 refills | Status: DC | PRN

## 2023-10-04 NOTE — Progress Notes (Signed)
 Established Patient Office Visit  Subjective:  Patient ID: LAQUENTA WHITSELL, female    DOB: [birth year]  Age: 38 y.o. MRN: [id number]  Chief Complaint  Patient presents with   Follow-up    Weight management    No new complaints, here for lab review and medication refills Weight stable now that she'Mykael Batz taking her remaining Zepbound  every other week. Joint pains and carpal tunnel symptoms ISQ on current dose of Tramadol .     No other concerns at this time.   Past Medical History:  Diagnosis Date   Depression     Past Surgical History:  Procedure Laterality Date   adenocetomy     ESOPHAGOGASTRODUODENOSCOPY ENDOSCOPY     TUBAL LIGATION      Social History   Socioeconomic History   Marital status: Married    Spouse name: Not on file   Number of children: 3   Years of education: Not on file   Highest education level: Not on file  Occupational History   Occupation: warehouse  Tobacco Use   Smoking status: Every Day    Current packs/day: 1.00    Average packs/day: 1 pack/day for 20.0 years (20.0 ttl pk-yrs)    Types: Cigarettes   Smokeless tobacco: Never  Vaping Use   Vaping status: Never Used  Substance and Sexual Activity   Alcohol use: No   Drug use: No   Sexual activity: Yes    Birth control/protection: Surgical  Other Topics Concern   Not on file  Social History Narrative   Not on file   Social Drivers of Health   Financial Resource Strain: Not on file  Food Insecurity: Not on file  Transportation Needs: Not on file  Physical Activity: Not on file  Stress: Not on file  Social Connections: Not on file  Intimate Partner Violence: Not on file    Family History  Problem Relation Age of Onset   Diabetes Paternal Aunt     No Known Allergies  Outpatient Medications Prior to Visit  Medication Sig   tirzepatide  (ZEPBOUND ) 15 MG/0.5ML Pen Inject 15 mg into the skin once a week.   [DISCONTINUED] traMADol  (ULTRAM ) 50 MG tablet Take 1 tablet (50 mg total) by  mouth every 6 (six) hours as needed.   pantoprazole  (PROTONIX ) 40 MG tablet Take 1 tablet (40 mg total) by mouth daily. (Patient not taking: Reported on 10/04/2023)   No facility-administered medications prior to visit.    Review of Systems  Constitutional:  Negative for weight loss.  HENT: Negative.    Eyes: Negative.   Respiratory: Negative.    Cardiovascular:  Negative for chest pain, palpitations and orthopnea.  Gastrointestinal: Negative.   Genitourinary: Negative.   Skin: Negative.   Neurological: Negative.   Endo/Heme/Allergies: Negative.   Psychiatric/Behavioral:  The patient is nervous/anxious.        Objective:   BP 108/60   Pulse 71   Temp 99.2 F (37.3 C)   Ht 5' 3 (1.6 m)   Wt 133 lb (60.3 kg)   SpO2 99%   BMI 23.56 kg/m   Vitals:   10/04/23 1515  BP: 108/60  Pulse: 71  Temp: 99.2 F (37.3 C)  Height: 5' 3 (1.6 m)  Weight: 133 lb (60.3 kg)  SpO2: 99%  BMI (Calculated): 23.57    Physical Exam Vitals reviewed.  Constitutional:      General: She is not in acute distress.    Appearance: She is overweight.  HENT:  Head: Normocephalic.     Nose: Nose normal.     Mouth/Throat:     Mouth: Mucous membranes are moist.  Eyes:     Extraocular Movements: Extraocular movements intact.     Pupils: Pupils are equal, round, and reactive to light.  Cardiovascular:     Rate and Rhythm: Normal rate and regular rhythm.     Heart sounds: No murmur heard. Pulmonary:     Effort: Pulmonary effort is normal.     Breath sounds: No rhonchi or rales.  Abdominal:     General: Abdomen is flat.     Palpations: There is no hepatomegaly, splenomegaly or mass.  Musculoskeletal:        General: Normal range of motion.     Cervical back: Normal range of motion. No tenderness.     Right knee: No swelling. No tenderness. No MCL or patellar tendon tenderness.     Left knee: No MCL or patellar tendon tenderness.  Skin:    General: Skin is warm and dry.   Neurological:     General: No focal deficit present.     Mental Status: She is alert and oriented to person, place, and time.     Cranial Nerves: No cranial nerve deficit.     Motor: No weakness.  Psychiatric:        Mood and Affect: Mood normal.        Behavior: Behavior normal.      No results found for any visits on 10/04/23.  No results found for this or any previous visit (from the past 2160 hours).    Assessment & Plan:  As per problem list  Problem List Items Addressed This Visit       Nervous and Auditory   Bilateral carpal tunnel syndrome   Relevant Medications   traMADol  (ULTRAM ) 50 MG tablet    Return in about 2 months (around 12/05/2023).   Total time spent: 20 minutes  Sherrill Cinderella Perry, MD  10/04/2023   This document may have been prepared by [facility] Voice Recognition software and as such may include unintentional dictation errors.

## 2023-11-20 ENCOUNTER — Other Ambulatory Visit: Payer: Self-pay

## 2023-11-20 ENCOUNTER — Encounter: Payer: Self-pay | Admitting: Internal Medicine

## 2023-11-20 DIAGNOSIS — G5603 Carpal tunnel syndrome, bilateral upper limbs: Secondary | ICD-10-CM

## 2023-11-22 ENCOUNTER — Other Ambulatory Visit: Payer: Self-pay | Admitting: Internal Medicine

## 2023-11-22 DIAGNOSIS — G5603 Carpal tunnel syndrome, bilateral upper limbs: Secondary | ICD-10-CM

## 2023-12-06 ENCOUNTER — Ambulatory Visit: Admitting: Internal Medicine

## 2023-12-06 VITALS — BP 102/60 | Temp 98.2°F | Ht 63.0 in | Wt 129.6 lb

## 2023-12-06 DIAGNOSIS — G5603 Carpal tunnel syndrome, bilateral upper limbs: Secondary | ICD-10-CM | POA: Diagnosis not present

## 2023-12-06 DIAGNOSIS — Z0130 Encounter for examination of blood pressure without abnormal findings: Secondary | ICD-10-CM

## 2023-12-06 DIAGNOSIS — F17200 Nicotine dependence, unspecified, uncomplicated: Secondary | ICD-10-CM | POA: Diagnosis not present

## 2023-12-06 DIAGNOSIS — F418 Other specified anxiety disorders: Secondary | ICD-10-CM

## 2023-12-06 NOTE — Progress Notes (Signed)
 Established Patient Office Visit  Subjective:  Patient ID: Gabrielle Harmon, female    DOB: 1985/06/06  Age: 38 y.o. MRN: [id number]  Chief Complaint  Patient presents with   Follow-up    2 month follow up    No new complaints, here for lab review and medication refills. Still losing weight with diet and exercise. Carpal tunnel pain ISQ.    No other concerns at this time.   Past Medical History:  Diagnosis Date   Depression     Past Surgical History:  Procedure Laterality Date   adenocetomy     ESOPHAGOGASTRODUODENOSCOPY ENDOSCOPY     TUBAL LIGATION      Social History   Socioeconomic History   Marital status: Married    Spouse name: Not on file   Number of children: 3   Years of education: Not on file   Highest education level: Not on file  Occupational History   Occupation: warehouse  Tobacco Use   Smoking status: Every Day    Current packs/day: 1.00    Average packs/day: 1 pack/day for 20.0 years (20.0 ttl pk-yrs)    Types: Cigarettes   Smokeless tobacco: Never  Vaping Use   Vaping status: Never Used  Substance and Sexual Activity   Alcohol use: No   Drug use: No   Sexual activity: Yes    Birth control/protection: Surgical  Other Topics Concern   Not on file  Social History Narrative   Not on file   Social Drivers of Health   Financial Resource Strain: Not on file  Food Insecurity: Not on file  Transportation Needs: Not on file  Physical Activity: Not on file  Stress: Not on file  Social Connections: Not on file  Intimate Partner Violence: Not on file    Family History  Problem Relation Age of Onset   Diabetes Paternal Aunt     No Known Allergies  Outpatient Medications Prior to Visit  Medication Sig   FLUoxetine (PROZAC) 20 MG tablet Take 20 mg by mouth daily.   tirzepatide  (ZEPBOUND ) 15 MG/0.5ML Pen Inject 15 mg into the skin once a week.   traMADol  (ULTRAM ) 50 MG tablet TAKE 1 TABLET BY MOUTH EVERY 6 HOURS AS NEEDED   pantoprazole   (PROTONIX ) 40 MG tablet Take 1 tablet (40 mg total) by mouth daily. (Patient not taking: Reported on 12/06/2023)   No facility-administered medications prior to visit.    Review of Systems  Constitutional:  Negative for weight loss.  HENT: Negative.    Eyes: Negative.   Respiratory: Negative.    Cardiovascular:  Negative for chest pain, palpitations and orthopnea.  Gastrointestinal: Negative.   Genitourinary: Negative.   Skin: Negative.   Neurological: Negative.   Endo/Heme/Allergies: Negative.   Psychiatric/Behavioral:  The patient is nervous/anxious.        Objective:   BP 102/60   Temp 98.2 F (36.8 C)   Ht 5' 3 (1.6 m)   Wt 129 lb 9.6 oz (58.8 kg)   BMI 22.96 kg/m   Vitals:   12/06/23 1428  BP: 102/60  Temp: 98.2 F (36.8 C)  Height: 5' 3 (1.6 m)  Weight: 129 lb 9.6 oz (58.8 kg)  BMI (Calculated): 22.96    Physical Exam Vitals reviewed.  Constitutional:      General: She is not in acute distress.    Appearance: She is overweight.  HENT:     Head: Normocephalic.     Nose: Nose normal.  Mouth/Throat:     Mouth: Mucous membranes are moist.  Eyes:     Extraocular Movements: Extraocular movements intact.     Pupils: Pupils are equal, round, and reactive to light.  Cardiovascular:     Rate and Rhythm: Normal rate and regular rhythm.     Heart sounds: No murmur heard. Pulmonary:     Effort: Pulmonary effort is normal.     Breath sounds: No rhonchi or rales.  Abdominal:     General: Abdomen is flat.     Palpations: There is no hepatomegaly, splenomegaly or mass.  Musculoskeletal:        General: Normal range of motion.     Cervical back: Normal range of motion. No tenderness.     Right knee: No swelling. No tenderness. No MCL or patellar tendon tenderness.     Left knee: No MCL or patellar tendon tenderness.  Skin:    General: Skin is warm and dry.  Neurological:     General: No focal deficit present.     Mental Status: She is alert and oriented  to person, place, and time.     Cranial Nerves: No cranial nerve deficit.     Motor: No weakness.  Psychiatric:        Mood and Affect: Mood normal.        Behavior: Behavior normal.      No results found for any visits on 12/06/23.  No results found for this or any previous visit (from the past 2160 hours).    Assessment & Plan:  As per problem list  Problem List Items Addressed This Visit       Nervous and Auditory   Bilateral carpal tunnel syndrome   Relevant Medications   FLUoxetine (PROZAC) 20 MG tablet     Other   Tobacco dependence (Chronic)   Other Visit Diagnoses       Situational anxiety    -  Primary   Relevant Medications   FLUoxetine (PROZAC) 20 MG tablet       Return in about 2 months (around 02/05/2024) for Weight management.   Total time spent: 20 minutes  Sherrill Cinderella Perry, MD  12/06/2023   This document may have been prepared by [facility] Voice Recognition software and as such may include unintentional dictation errors.

## 2023-12-20 ENCOUNTER — Other Ambulatory Visit: Payer: Self-pay | Admitting: Internal Medicine

## 2023-12-20 DIAGNOSIS — G5603 Carpal tunnel syndrome, bilateral upper limbs: Secondary | ICD-10-CM

## 2024-01-21 ENCOUNTER — Other Ambulatory Visit: Payer: Self-pay

## 2024-01-21 DIAGNOSIS — G5603 Carpal tunnel syndrome, bilateral upper limbs: Secondary | ICD-10-CM

## 2024-01-21 MED ORDER — TRAMADOL HCL 50 MG PO TABS: 50.0000 mg | ORAL_TABLET | Freq: Four times a day (QID) | ORAL | 0 refills | Status: DC | PRN

## 2024-02-03 ENCOUNTER — Ambulatory Visit: Admitting: Internal Medicine

## 2024-02-18 ENCOUNTER — Ambulatory Visit: Admitting: Internal Medicine

## 2024-02-18 DIAGNOSIS — G5603 Carpal tunnel syndrome, bilateral upper limbs: Secondary | ICD-10-CM

## 2024-02-18 MED ORDER — TRAMADOL HCL 50 MG PO TABS: 50.0000 mg | ORAL_TABLET | Freq: Four times a day (QID) | ORAL | 0 refills | Status: AC | PRN

## 2024-02-18 NOTE — Progress Notes (Signed)
 Established Patient Office Visit  Subjective:  Patient ID: Gabrielle Harmon, female    DOB: [birth year]  Age: 38 y.o. MRN: [id number]  Chief Complaint  Patient presents with   Follow-up    Check Up    No new complaints, here for medication refills.     No other concerns at this time.   Past Medical History:  Diagnosis Date   Depression     Past Surgical History:  Procedure Laterality Date   adenocetomy     ESOPHAGOGASTRODUODENOSCOPY ENDOSCOPY     TUBAL LIGATION      Social History   Socioeconomic History   Marital status: Married    Spouse name: Not on file   Number of children: 3   Years of education: Not on file   Highest education level: Not on file  Occupational History   Occupation: warehouse  Tobacco Use   Smoking status: Every Day    Current packs/day: 1.00    Average packs/day: 1 pack/day for 20.0 years (20.0 ttl pk-yrs)    Types: Cigarettes   Smokeless tobacco: Never  Vaping Use   Vaping status: Never Used  Substance and Sexual Activity   Alcohol use: No   Drug use: No   Sexual activity: Yes    Birth control/protection: Surgical  Other Topics Concern   Not on file  Social History Narrative   Not on file   Social Drivers of Health   Financial Resource Strain: Not on file  Food Insecurity: Not on file  Transportation Needs: Not on file  Physical Activity: Not on file  Stress: Not on file  Social Connections: Not on file  Intimate Partner Violence: Not on file    Family History  Problem Relation Age of Onset   Diabetes Paternal Aunt     No Known Allergies  Outpatient Medications Prior to Visit  Medication Sig   FLUoxetine (PROZAC) 20 MG tablet Take 20 mg by mouth daily.   [DISCONTINUED] tirzepatide  (ZEPBOUND ) 15 MG/0.5ML Pen Inject 15 mg into the skin once a week.   [DISCONTINUED] traMADol  (ULTRAM ) 50 MG tablet Take 1 tablet (50 mg total) by mouth every 6 (six) hours as needed.   [DISCONTINUED] pantoprazole  (PROTONIX ) 40 MG tablet  Take 1 tablet (40 mg total) by mouth daily. (Patient not taking: Reported on 12/06/2023)   No facility-administered medications prior to visit.    Review of Systems  Constitutional:  Negative for weight loss.  HENT: Negative.    Eyes: Negative.   Respiratory: Negative.    Cardiovascular:  Negative for chest pain, palpitations and orthopnea.  Gastrointestinal: Negative.   Genitourinary: Negative.   Skin: Negative.   Neurological: Negative.   Endo/Heme/Allergies: Negative.   Psychiatric/Behavioral:  The patient is nervous/anxious.        Objective:   BP (!) 96/52   Pulse 62   Ht 5' 3 (1.6 m)   Wt 136 lb (61.7 kg)   SpO2 97%   BMI 24.09 kg/m   Vitals:   02/18/24 1546  BP: (!) 96/52  Pulse: 62  Height: 5' 3 (1.6 m)  Weight: 136 lb (61.7 kg)  SpO2: 97%  BMI (Calculated): 24.1    Physical Exam Vitals reviewed.  Constitutional:      General: She is not in acute distress.    Appearance: She is overweight.  HENT:     Head: Normocephalic.     Nose: Nose normal.     Mouth/Throat:     Mouth: Mucous membranes are  moist.  Eyes:     Extraocular Movements: Extraocular movements intact.     Pupils: Pupils are equal, round, and reactive to light.  Cardiovascular:     Rate and Rhythm: Normal rate and regular rhythm.     Heart sounds: No murmur heard. Pulmonary:     Effort: Pulmonary effort is normal.     Breath sounds: No rhonchi or rales.  Abdominal:     General: Abdomen is flat.     Palpations: There is no hepatomegaly, splenomegaly or mass.  Musculoskeletal:        General: Normal range of motion.     Cervical back: Normal range of motion. No tenderness.     Right knee: No swelling. No tenderness. No MCL or patellar tendon tenderness.     Left knee: No MCL or patellar tendon tenderness.  Skin:    General: Skin is warm and dry.  Neurological:     General: No focal deficit present.     Mental Status: She is alert and oriented to person, place, and time.      Cranial Nerves: No cranial nerve deficit.     Motor: No weakness.  Psychiatric:        Mood and Affect: Mood normal.        Behavior: Behavior normal.      No results found for any visits on 02/18/24.  No results found for this or any previous visit (from the past 2160 hours).    Assessment & Plan:   Problem List Items Addressed This Visit       Nervous and Auditory   Bilateral carpal tunnel syndrome   Relevant Medications   traMADol  (ULTRAM ) 50 MG tablet (Start on 03/19/2024)   traMADol  (ULTRAM ) 50 MG tablet    Return in about 2 years (around 02/17/2026).   Total time spent: 20 minutes. This time includes review of previous notes and results and patient face to face interaction during today'Liberti Appleton visit.    Sherrill Cinderella Perry, MD  02/18/2024   This document may have been prepared by [facility] Voice Recognition software and as such may include unintentional dictation errors.

## 2024-04-07 ENCOUNTER — Encounter: Payer: Self-pay | Admitting: Internal Medicine

## 2024-04-08 ENCOUNTER — Other Ambulatory Visit: Payer: Self-pay | Admitting: Internal Medicine

## 2024-04-10 ENCOUNTER — Other Ambulatory Visit: Payer: Self-pay | Admitting: Internal Medicine

## 2024-04-10 DIAGNOSIS — E669 Obesity, unspecified: Secondary | ICD-10-CM

## 2024-04-10 MED ORDER — WEGOVY 2.4 MG/0.75ML ~~LOC~~ SOAJ: 2.4000 mg | SUBCUTANEOUS | 2 refills | Status: AC

## 2024-04-17 ENCOUNTER — Encounter: Payer: Self-pay | Admitting: Internal Medicine

## 2024-05-26 ENCOUNTER — Ambulatory Visit: Admitting: Internal Medicine
